# Patient Record
Sex: Female | Born: 1980 | Race: Black or African American | Hispanic: No | Marital: Single | Smoking: Never smoker
Health system: Southern US, Community
[De-identification: ages and names within clinical notes are randomized; demographics above are authoritative.]

## PROBLEM LIST (undated history)

## (undated) ENCOUNTER — Emergency Department (HOSPITAL_COMMUNITY): Admission: EM | Payer: BC Managed Care – PPO | Source: Home / Self Care

## (undated) DIAGNOSIS — G459 Transient cerebral ischemic attack, unspecified: Secondary | ICD-10-CM

## (undated) DIAGNOSIS — I10 Essential (primary) hypertension: Secondary | ICD-10-CM

## (undated) HISTORY — PX: NO PAST SURGERIES: SHX2092

## (undated) HISTORY — DX: Transient cerebral ischemic attack, unspecified: G45.9

---

## 2011-02-12 DIAGNOSIS — I10 Essential (primary) hypertension: Secondary | ICD-10-CM | POA: Insufficient documentation

## 2011-10-31 ENCOUNTER — Emergency Department: Payer: Self-pay | Admitting: Emergency Medicine

## 2011-10-31 LAB — COMPREHENSIVE METABOLIC PANEL
Albumin: 3.6 g/dL (ref 3.4–5.0)
Alkaline Phosphatase: 66 U/L (ref 50–136)
Anion Gap: 7 (ref 7–16)
Bilirubin,Total: 0.2 mg/dL (ref 0.2–1.0)
Calcium, Total: 8.6 mg/dL (ref 8.5–10.1)
Chloride: 105 mmol/L (ref 98–107)
Co2: 29 mmol/L (ref 21–32)
EGFR (African American): >60
Potassium: 3.7 mmol/L (ref 3.5–5.1)
SGPT (ALT): 21 U/L
Sodium: 141 mmol/L (ref 136–145)
Total Protein: 7.6 g/dL (ref 6.4–8.2)

## 2011-10-31 LAB — CBC
HCT: 42.5 % (ref 35.0–47.0)
HGB: 13.8 g/dL (ref 12.0–16.0)
MCH: 26.6 pg (ref 26.0–34.0)
MCHC: 32.4 g/dL (ref 32.0–36.0)
MCV: 82 fL (ref 80–100)
Platelet: 256 10*3/uL (ref 150–440)
RDW: 13.7 % (ref 11.5–14.5)
WBC: 8 10*3/uL (ref 3.6–11.0)

## 2011-10-31 LAB — CK TOTAL AND CKMB (NOT AT ARMC)
CK, Total: 95 U/L (ref 21–215)
CK-MB: <0.5 ng/mL — ABNORMAL LOW (ref 0.5–3.6)

## 2011-10-31 LAB — TROPONIN I: Troponin-I: <0.02 ng/mL

## 2012-07-05 ENCOUNTER — Encounter: Payer: Self-pay | Admitting: Family Medicine

## 2012-08-10 DIAGNOSIS — Z87410 Personal history of cervical dysplasia: Secondary | ICD-10-CM | POA: Insufficient documentation

## 2013-08-16 ENCOUNTER — Emergency Department: Payer: Self-pay | Admitting: Emergency Medicine

## 2013-08-16 LAB — BASIC METABOLIC PANEL
Anion Gap: 8 (ref 7–16)
BUN: 12 mg/dL (ref 7–18)
CALCIUM: 9.3 mg/dL (ref 8.5–10.1)
CO2: 30 mmol/L (ref 21–32)
Chloride: 99 mmol/L (ref 98–107)
Creatinine: 0.88 mg/dL (ref 0.60–1.30)
EGFR (African American): >60
EGFR (Non-African Amer.): >60
Glucose: 96 mg/dL (ref 65–99)
Osmolality: 273 (ref 275–301)
Potassium: 3.4 mmol/L — ABNORMAL LOW (ref 3.5–5.1)
Sodium: 137 mmol/L (ref 136–145)

## 2013-08-16 LAB — CBC
HCT: 44.1 % (ref 35.0–47.0)
HGB: 13.8 g/dL (ref 12.0–16.0)
MCH: 25.6 pg — ABNORMAL LOW (ref 26.0–34.0)
MCHC: 31.4 g/dL — ABNORMAL LOW (ref 32.0–36.0)
MCV: 82 fL (ref 80–100)
Platelet: 291 10*3/uL (ref 150–440)
RBC: 5.4 10*6/uL — AB (ref 3.80–5.20)
RDW: 14.1 % (ref 11.5–14.5)
WBC: 7.7 10*3/uL (ref 3.6–11.0)

## 2013-08-16 LAB — TROPONIN I: Troponin-I: <0.02 ng/mL

## 2016-12-18 ENCOUNTER — Encounter: Payer: Self-pay | Admitting: Emergency Medicine

## 2016-12-18 ENCOUNTER — Emergency Department
Admission: EM | Admit: 2016-12-18 | Discharge: 2016-12-18 | Disposition: A | Discharge status: Home / Self Care | Payer: BC Managed Care – PPO | Attending: Emergency Medicine | Admitting: Emergency Medicine

## 2016-12-18 DIAGNOSIS — I10 Essential (primary) hypertension: Secondary | ICD-10-CM | POA: Insufficient documentation

## 2016-12-18 DIAGNOSIS — N898 Other specified noninflammatory disorders of vagina: Secondary | ICD-10-CM | POA: Insufficient documentation

## 2016-12-18 HISTORY — DX: Essential (primary) hypertension: I10

## 2016-12-18 LAB — WET PREP, GENITAL
CLUE CELLS WET PREP: NONE SEEN
Sperm: NONE SEEN
TRICH WET PREP: NONE SEEN
Yeast Wet Prep HPF POC: NONE SEEN

## 2016-12-18 LAB — URINALYSIS, COMPLETE (UACMP) WITH MICROSCOPIC
BACTERIA UA: NONE SEEN
BILIRUBIN URINE: NEGATIVE
Glucose, UA: NEGATIVE mg/dL
KETONES UR: NEGATIVE mg/dL
Leukocytes, UA: NEGATIVE
Nitrite: NEGATIVE
PH: 6 (ref 5.0–8.0)
Protein, ur: 30 mg/dL — AB
Specific Gravity, Urine: 1.028 (ref 1.005–1.030)

## 2016-12-18 LAB — CHLAMYDIA/NGC RT PCR (ARMC ONLY)
CHLAMYDIA TR: NOT DETECTED
N gonorrhoeae: NOT DETECTED

## 2016-12-18 LAB — POCT PREGNANCY, URINE: Preg Test, Ur: NEGATIVE

## 2016-12-18 NOTE — Discharge Instructions (Signed)
Follow-up with your primary care doctor if not improving. Discontinue use of Flagyl. You will get the results of your cultures the first week if they are positive.

## 2016-12-18 NOTE — ED Provider Notes (Signed)
[facility] Emergency Department Provider Note  ____________________________________________   None    (approximate)  I have reviewed the triage vital signs and the nursing notes.   HISTORY  Chief Complaint Possible yeast infection   HPI Alicia Perez is a 36 y.o. female is here with complaint of white vaginal discharge. Patient states that she has a history of recurrent bacterial vaginosis and has been using Flagyl suppositories frequently for the symptoms. She now complains of burning and irritation that began last night. Patient also reports vaginal itching. Also while she is here she is requesting a gonorrhea and chlamydia culture be done.   Past Medical History:  Diagnosis Date  . Hypertension     There are no active problems to display for this patient.   No past surgical history on file.  Prior to Admission medications   Not on File    Allergies Codeine and Penicillins  No family history on file.  Social History Social History  Substance Use Topics  . Smoking status: Not on file  . Smokeless tobacco: Not on file  . Alcohol use Not on file    Review of Systems  Constitutional: No fever/chills ENT: No sore throat. Cardiovascular: Denies chest pain. Respiratory: Denies shortness of breath. Gastrointestinal: No abdominal pain.  No nausea, no vomiting.   Genitourinary: Positive for dysuria. Positive for vaginal discharge. Musculoskeletal: Negative for back pain. ___________________________________________   PHYSICAL EXAM:  VITAL SIGNS: ED Triage Vitals  Enc Vitals Group     BP 12/18/16 1535 130/81     Pulse Rate 12/18/16 1535 98     Resp 12/18/16 1535 16     Temp 12/18/16 1535 99.1 F (37.3 C)     Temp Source 12/18/16 1535 Oral     SpO2 12/18/16 1535 97 %     Weight 12/18/16 1538 160 lb (72.6 kg)     Height 12/18/16 1538 5\' 2"  (1.575 m)     Head Circumference --      Peak Flow --      Pain Score --      Pain Loc  --      Pain Edu? --      Excl. in GC? --    Constitutional: Alert and oriented. Well appearing and in no acute distress. Eyes: Conjunctivae are normal.  Head: Atraumatic. Neck: No stridor.   Cardiovascular: Normal rate, regular rhythm. Grossly normal heart sounds.  Good peripheral circulation. Respiratory: Normal respiratory effort.  No retractions. Lungs CTAB. Gastrointestinal: Soft and nontender. No distention.  Genitourinary: There is thick white substance present in the vaginal vault however is difficult to determine whether this is actual yeastlike material or if it was the medication the patient used 2 days ago. There is no adnexal masses or tenderness noted. There is no cervical tenderness on bimanual exam. Cultures and wet prep were obtained. Musculoskeletal: No lower extremity tenderness nor edema.  No joint effusions. Neurologic:  Normal speech and language. No gross focal neurologic deficits are appreciated. No gait instability. Skin:  Skin is warm, dry and intact. No rash noted. Psychiatric: Mood and affect are normal. Speech and behavior are normal.  ____________________________________________   LABS (all labs ordered are listed, but only abnormal results are displayed)  Labs Reviewed  WET PREP, GENITAL - Abnormal; Notable for the following:       Result Value   WBC, Wet Prep HPF POC FEW (*)    All other components within normal limits  URINALYSIS, COMPLETE (  UACMP) WITH MICROSCOPIC - Abnormal; Notable for the following:    Color, Urine YELLOW (*)    APPearance CLEAR (*)    Hgb urine dipstick SMALL (*)    Protein, ur 30 (*)    Squamous Epithelial / LPF 0-5 (*)    All other components within normal limits  CHLAMYDIA/NGC RT PCR ([facility] ONLY)  POC URINE PREG, ED  POCT PREGNANCY, URINE    PROCEDURES  Procedure(s) performed: None  Procedures  Critical Care performed: No  ____________________________________________   INITIAL IMPRESSION / ASSESSMENT  AND PLAN / ED COURSE  Pertinent labs & imaging results that were available during my care of the patient were reviewed by me and considered in my medical decision making (see chart for details).  Patient was strongly encouraged to discontinue using Flagyl suppositories without diagnosis. Patient states that she uses them anytime she has any symptoms and is able to get a supply of them. At this time we will treat symptoms only with over-the-counter preparations such as vagisell.  She will follow-up with her PCP if any continued problems. She also is aware that she will be called if her GC and chlamydia culture are positive.      ____________________________________________   FINAL CLINICAL IMPRESSION(S) / ED DIAGNOSES  Final diagnoses:  Vaginal irritation      NEW MEDICATIONS STARTED DURING THIS VISIT:  There are no discharge medications for this patient.    Note:  This document was prepared using Dragon voice recognition software and may include unintentional dictation errors.    Tommi Rumps, PA-C 12/18/16 1742    Sharman Cheek, MD 12/18/16 [phone]

## 2016-12-18 NOTE — ED Notes (Signed)
Pt c/o vaginal burning. Pt states she gets frequent BV infections and states she

## 2016-12-18 NOTE — ED Notes (Signed)
Pt c/o vaginal burning, pt has frequent BV infections and uses Flagyl suppositories. Pt states she last used one 2 days ago. Pt also requesting to have STD check.  GC/CT cultures obtained, encouraged patient to see PCP or [facility] for Syphillis testing and HIV testing if she desired.

## 2016-12-18 NOTE — ED Triage Notes (Signed)
Pt reports vaginal burning and irritation that began last night. Pt reports noticing a thick white cheesy like substance. Pt reports recurrent bacterial vaginosis and used a flagyl suppository but symptoms have persisted. Pt also reports vaginal itching.

## 2018-01-02 ENCOUNTER — Other Ambulatory Visit: Payer: Self-pay

## 2018-01-02 ENCOUNTER — Encounter (HOSPITAL_COMMUNITY): Payer: Self-pay | Admitting: Emergency Medicine

## 2018-01-02 ENCOUNTER — Emergency Department (HOSPITAL_COMMUNITY)
Admission: EM | Admit: 2018-01-02 | Discharge: 2018-01-02 | Disposition: A | Discharge status: Home / Self Care | Payer: BC Managed Care – PPO | Attending: Emergency Medicine | Admitting: Emergency Medicine

## 2018-01-02 DIAGNOSIS — W228XXA Striking against or struck by other objects, initial encounter: Secondary | ICD-10-CM | POA: Insufficient documentation

## 2018-01-02 DIAGNOSIS — Y9389 Activity, other specified: Secondary | ICD-10-CM | POA: Diagnosis not present

## 2018-01-02 DIAGNOSIS — S61412A Laceration without foreign body of left hand, initial encounter: Secondary | ICD-10-CM | POA: Diagnosis not present

## 2018-01-02 DIAGNOSIS — Y998 Other external cause status: Secondary | ICD-10-CM | POA: Insufficient documentation

## 2018-01-02 DIAGNOSIS — I10 Essential (primary) hypertension: Secondary | ICD-10-CM | POA: Insufficient documentation

## 2018-01-02 DIAGNOSIS — Y929 Unspecified place or not applicable: Secondary | ICD-10-CM | POA: Insufficient documentation

## 2018-01-02 MED ORDER — LIDOCAINE-EPINEPHRINE (PF) 2 %-1:200000 IJ SOLN
10.0000 mL | Freq: Once | INTRAMUSCULAR | Status: AC
  Administered 2018-01-02: 10 mL via INTRADERMAL
  Filled 2018-01-02: qty 20

## 2018-01-02 NOTE — Discharge Instructions (Addendum)
Keep your laceration clean. You can wash it with soap and water. If bleeding, apply firm pressure. Follow up in 7-10 days for suture removal. Watch for any signs of infection.

## 2018-01-02 NOTE — ED Provider Notes (Signed)
[facility] EMERGENCY DEPARTMENT Provider Note   CSN: [id number] Arrival date & time: 01/02/18  1707     History   Chief Complaint Chief Complaint  Patient presents with  . Extremity Laceration    HPI Alicia Perez is a 37 y.o. female.  HPI Alicia Perez is a 37 y.o. female presents to ED with complaint of a laceration. Pt states she was moving a refrigerator and cut the palm of left hand. Injury occurred earlier today. Reports pain over the laceration.  Pain described as sharp, moderate.  Numbness or weakness to the hand or fingertips. Tdap is up-to-date she states that she stop the bleeding, but the bleeding started again which is why she is here.  No other injuries.  She states nothing making her symptoms better or worse.  Past Medical History:  Diagnosis Date  . Hypertension     There are no active problems to display for this patient.   History reviewed. No pertinent surgical history.   OB History   None      Home Medications    Prior to Admission medications   Not on File    Family History No family history on file.  Social History Social History   Tobacco Use  . Smoking status: Never Smoker  . Smokeless tobacco: Never Used  Substance Use Topics  . Alcohol use: Yes  . Drug use: Not Currently     Allergies   Codeine and Penicillins   Review of Systems Review of Systems  Constitutional: Negative for chills and fever.  Musculoskeletal: Positive for arthralgias.  Skin: Positive for wound.  Neurological: Negative for weakness and numbness.  All other systems reviewed and are negative.    Physical Exam Updated Vital Signs BP (!) 132/94 (BP Location: Right Arm)   Pulse (!) 103   Temp 99.3 F (37.4 C) (Oral)   Resp 16   SpO2 98%   Physical Exam  Constitutional: She appears well-developed and well-nourished. No distress.  Eyes: Conjunctivae are normal.  Neck: Neck supple.  Musculoskeletal:  Full range of motion of  all fingers, specifically full flexion extension of the left fifth finger at each joint.  Strength is intact.  Sensation intact distally.  Capillary refill less than 2 seconds distally.  Neurological: She is alert.  Skin: Skin is warm and dry.  3 cm laceration to the palm of the left hand, specifically just proximal to fifth MCP joint.  Laceration appears to be superficial.  Hemostatic at this time.  Nursing note and vitals reviewed.    ED Treatments / Results  Labs (all labs ordered are listed, but only abnormal results are displayed) Labs Reviewed - No data to display  EKG None  Radiology No results found.  Procedures .Marland KitchenLaceration Repair Date/Time: 01/02/2018 6:17 PM Performed by: Jaynie Crumble, PA-C Authorized by: Jaynie Crumble, PA-C   Consent:    Consent obtained:  Verbal   Consent given by:  Patient   Risks discussed:  Infection, pain, poor cosmetic result, nerve damage and vascular damage   Alternatives discussed:  No treatment and observation Anesthesia (see MAR for exact dosages):    Anesthesia method:  Local infiltration   Local anesthetic:  Lidocaine 2% WITH epi Laceration details:    Location:  Hand   Hand location:  L palm   Length (cm):  3 Repair type:    Repair type:  Simple Pre-procedure details:    Preparation:  Patient was prepped and draped in usual sterile fashion  Exploration:    Hemostasis achieved with:  Epinephrine and direct pressure   Wound extent: no fascia violation noted, no foreign bodies/material noted, no nerve damage noted and no vascular damage noted     Contaminated: no   Treatment:    Area cleansed with:  Betadine, saline and Shur-Clens   Amount of cleaning:  Standard   Irrigation solution:  Sterile saline   Irrigation method:  Syringe   Visualized foreign bodies/material removed: no   Skin repair:    Repair method:  Sutures   Suture size:  5-0   Suture material:  Prolene   Suture technique:  Simple interrupted    Number of sutures:  4 Approximation:    Approximation:  Close Post-procedure details:    Dressing:  Sterile dressing   (including critical care time)  Medications Ordered in ED Medications - No data to display   Initial Impression / Assessment and Plan / ED Course  I have reviewed the triage vital signs and the nursing notes.  Pertinent labs & imaging results that were available during my care of the patient were reviewed by me and considered in my medical decision making (see chart for details).     Patient with a laceration to the left initially considered.  With Dermabond, however when I started cleaning the laceration continued to bleed.  Bleeding was stopped with pressure and lidocaine with epi.  Repaired with sutures.  We will have patient follow-up in 7 to 10 days for suture removal.  Instructed to keep her eye on any signs of infection.  Her tetanus is up-to-date.  Ibuprofen for pain.  Vitals:   01/02/18 1709  BP: (!) 132/94  Pulse: (!) 103  Resp: 16  Temp: 99.3 F (37.4 C)  TempSrc: Oral  SpO2: 98%     Final Clinical Impressions(s) / ED Diagnoses   Final diagnoses:  Laceration of left hand without foreign body, initial encounter    ED Discharge Orders    None       Jaynie CrumbleKirichenko, Maridee Slape, PA-C 01/02/18 1821    Long, Arlyss RepressJoshua G, MD 01/03/18 1019

## 2018-01-02 NOTE — ED Triage Notes (Signed)
C/o approx 1 inch laceration to palm of L hand from a refrigerator she was moving in her office around 2pm.

## 2018-11-23 ENCOUNTER — Ambulatory Visit: Payer: Self-pay | Admitting: Surgery

## 2018-12-02 ENCOUNTER — Other Ambulatory Visit: Payer: Self-pay

## 2018-12-02 ENCOUNTER — Encounter: Payer: Self-pay | Admitting: Surgery

## 2018-12-02 ENCOUNTER — Ambulatory Visit: Payer: BC Managed Care – PPO | Admitting: Surgery

## 2018-12-02 VITALS — BP 135/91 | HR 86 | Temp 97.2°F | Ht 63.0 in | Wt 172.0 lb

## 2018-12-02 DIAGNOSIS — K644 Residual hemorrhoidal skin tags: Secondary | ICD-10-CM | POA: Diagnosis not present

## 2018-12-02 NOTE — Progress Notes (Signed)
12/02/2018  Reason for Visit:  Thrombosed hemorrhoid  Referring Provider:  Franco Nonesheryl Lindley, FNP  History of Present Illness: Alicia Perez is a 38 y.o. female presenting for evaluation of thrombosed external hemorrhoid.  She reports that about 3 weeks ago she started having a lot of perianal pain and felt swelling and a bulge at the anal opening.  Her sister told her it was a hemorrhoid and she tried over the counter medications, but this did not work and subsequently she saw her PCP.  She was given Anusol and referred to general surgery.  The patient reports she had a history of constipation but recently she was been taking daily MiraLax and that has kept her regular with daily bowel movement without significant straining.  She notices if she misses a day of it that her stool gets harder.  She reports that the Anusol helped and she no longer has pain, but the area of swelling is still there and has not regressed.  Denies any blood in her stool or pain with bowel movement.  Past Medical History: Past Medical History:  Diagnosis Date  . Hypertension      Past Surgical History: No past surgical history on file.  Home Medications: Prior to Admission medications   Medication Sig Start Date End Date Taking? Authorizing Provider  hydrocortisone (ANUSOL-HC) 2.5 % rectal cream  11/22/18  Yes [provider]  traMADol (ULTRAM) 50 MG tablet  11/22/18  Yes [provider]    Allergies: Allergies  Allergen Reactions  . Codeine Hives  . Penicillins Hives    Social History:  reports that she has never smoked. She has never used smokeless tobacco. She reports current alcohol use. She reports previous drug use.   Family History: No family history on file.  Review of Systems: Review of Systems  Constitutional: Negative for chills and fever.  Eyes: Negative for blurred vision.  Respiratory: Negative for shortness of breath.   Cardiovascular: Negative for chest pain.   Gastrointestinal: Negative for abdominal pain, blood in stool, constipation, nausea and vomiting.  Genitourinary: Negative for dysuria.  Musculoskeletal: Negative for myalgias.  Skin: Negative for rash.  Neurological: Negative for dizziness.  Psychiatric/Behavioral: Negative for depression.    Physical Exam BP (!) 135/91   Pulse 86   Temp (!) 97.2 F (36.2 C) (Skin)   Ht 5\' 3"  (1.6 m)   Wt 172 lb (78 kg)   SpO2 98%   BMI 30.47 kg/m  CONSTITUTIONAL: No acute distress HEENT:  Normocephalic, atraumatic, extraocular motion intact. NECK: Trachea is midline, and there is no jugular venous distension.  RESPIRATORY:  Lungs are clear, and breath sounds are equal bilaterally. Normal respiratory effort without pathologic use of accessory muscles. CARDIOVASCULAR: Heart is regular without murmurs, gallops, or rubs. GI: The abdomen is soft, non-distended, non-tender.  RECTAL:  External exam reveals an enlarged but not inflamed right anterior external hemorrhoid.  No fissure or other lesions noted.  On digital exam, no enlarged internal components.  No blood on the glove. MUSCULOSKELETAL:  Normal muscle strength and tone in all four extremities.  No peripheral edema or cyanosis. SKIN: Skin turgor is normal. There are no pathologic skin lesions.  NEUROLOGIC:  Motor and sensation is grossly normal.  Cranial nerves are grossly intact. PSYCH:  Alert and oriented to person, place and time. Affect is normal.  Laboratory Analysis: No results found for this or any previous visit (from the past 24 hour(s)).  Imaging: No results found.  Assessment and Plan:  This is a 38 y.o. female with previously thrombosed external hemorrhoid.  Discussed with the patient that the swelling and enlargement of the external hemorrhoid after a flare up does take time to regress.  The important thing is that the pain has resolved.  She may continue using Anusol ointment as needed.  Discussed with her that she should  continue taking her MiraLax to keep the stool soft and decrease straining with bowel movement.  She may also do Sitz baths after bowel movements and/or twice daily to help soothe any external tissue that may be inflamed.  At this point there are no surgical needs.  Discussed with her that if the flare up happens again, to let us know within 72 hrs of symptoms so we can see her and do I&D of the hemorrhoid.  She understands this plan.  She will contact us if any issues or concerns.  Face-to-face time spent with the patient and care providers was 40 minutes, with more than 50% of the time spent counseling, educating, and coordinating care of the patient.     Melvyn Neth, [facility]

## 2018-12-02 NOTE — Patient Instructions (Addendum)
Patient to use cream as needed. The patient is aware to call back for any questions or concerns.  How to Take a [employer] IndustriesSitz Bath A sitz bath is a warm water bath that may be used to care for your rectum, genital area, or the area between your rectum and genitals (perineum). For a sitz bath, the water only comes up to your hips and covers your buttocks. A sitz bath may done at home in a bathtub or with a portable sitz bath that fits over the toilet. Your health care provider may recommend a sitz bath to help:  Relieve pain and discomfort after delivering a baby.  Relieve pain and itching from hemorrhoids or anal fissures.  Relieve pain after certain surgeries.  Relax muscles that are sore or tight. How to take a sitz bath Take 3-4 sitz baths a day, or as many as told by your health care provider. Bathtub sitz bath To take a sitz bath in a bathtub: 1. Partially fill a bathtub with warm water. The water should be deep enough to cover your hips and buttocks when you are sitting in the tub. 2. If your health care provider told you to put medicine in the water, follow his or her instructions. 3. Sit in the water. 4. Open the tub drain a little, and leave it open during your bath. 5. Turn on the warm water again, enough to replace the water that is draining out. Keep the water running throughout your bath. This helps keep the water at the right level and the right temperature. 6. Soak in the water for 15-20 minutes, or as long as told by your health care provider. 7. When you are done, be careful when you stand up. You may feel dizzy. 8. After the sitz bath, pat yourself dry. Do not rub your skin to dry it.  Over-the-toilet sitz bath To take a sitz bath with an over-the-toilet basin: 1. Follow the manufacturer's instructions. 2. Fill the basin with warm water. 3. If your health care provider told you to put medicine in the water, follow his or her instructions. 4. Sit on the seat. Make sure the  water covers your buttocks and perineum. 5. Soak in the water for 15-20 minutes, or as long as told by your health care provider. 6. After the sitz bath, pat yourself dry. Do not rub your skin to dry it. 7. Clean and dry the basin between uses. 8. Discard the basin if it cracks, or according to the manufacturer's instructions. Contact a health care provider if:  Your symptoms get worse. Do not continue with sitz baths if your symptoms get worse.  You have new symptoms. If this happens, do not continue with sitz baths until you talk with your health care provider. Summary  A sitz bath is a warm water bath in which the water only comes up to your hips and covers your buttocks.  A sitz bath may help relieve itching, relieve pain, and relax muscles that are sore or tight in the lower part of your body, including your genital area.  Take 3-4 sitz baths a day, or as many as told by your health care provider. Soak in the water for 15-20 minutes.  Do not continue with sitz baths if your symptoms get worse. This information is not intended to replace advice given to you by your health care provider. Make sure you discuss any questions you have with your health care provider. Document Released: 12/28/2003 Document Revised: 04/08/2017 Document  Reviewed: 04/08/2017 Elsevier Patient Education  [employer].

## 2019-04-12 ENCOUNTER — Telehealth: Payer: Self-pay | Admitting: Family Medicine

## 2019-04-12 NOTE — Telephone Encounter (Signed)
Patient has questions about immunizations.

## 2019-04-12 NOTE — Telephone Encounter (Signed)
TC to patient.  Questions answered re: Tdap.  Patient needs copy. RN to leave copy at info booth for patient. Aileen Fass, RN

## 2019-07-16 ENCOUNTER — Ambulatory Visit: Payer: BC Managed Care – PPO | Attending: Internal Medicine

## 2019-07-16 DIAGNOSIS — Z23 Encounter for immunization: Secondary | ICD-10-CM

## 2019-07-16 NOTE — Progress Notes (Signed)
   Covid-19 Vaccination Clinic  Name:  Alicia Perez    MRN: [id number] DOB: Oct 14, 1980  07/16/2019  Ms. Alicia Perez was observed post Covid-19 immunization for 15 minutes without incident. She was provided with Vaccine Information Sheet and instruction to access the V-Safe system.   Ms. Alicia Perez was instructed to call 911 with any severe reactions post vaccine: Marland Kitchen Difficulty breathing  . Swelling of face and throat  . A fast heartbeat  . A bad rash all over body  . Dizziness and weakness   Immunizations Administered    Name Date Dose VIS Date Route   Pfizer COVID-19 Vaccine 07/16/2019  1:15 PM 0.3 mL 03/31/2019 Intramuscular   Manufacturer: [employer], [employer]   Lot: [id number]   NDC: [address]-3

## 2019-08-09 ENCOUNTER — Ambulatory Visit: Payer: BC Managed Care – PPO | Attending: Internal Medicine

## 2019-08-09 DIAGNOSIS — Z23 Encounter for immunization: Secondary | ICD-10-CM

## 2019-08-09 NOTE — Progress Notes (Signed)
   Covid-19 Vaccination Clinic  Name:  Alicia Perez    MRN: [id number] DOB: [birth year]  08/09/2019  Alicia Perez was observed post Covid-19 immunization for 15 minutes without incident. She was provided with Vaccine Information Sheet and instruction to access the V-Safe system.   Alicia Perez was instructed to call 911 with any severe reactions post vaccine: Marland Kitchen Difficulty breathing  . Swelling of face and throat  . A fast heartbeat  . A bad rash all over body  . Dizziness and weakness   Immunizations Administered    Name Date Dose VIS Date Route   Pfizer COVID-19 Vaccine 08/09/2019 10:31 AM 0.3 mL 06/14/2018 Intramuscular   Manufacturer: [employer], [employer]   Lot: [id number]   NDC: [address]-0

## 2020-07-21 ENCOUNTER — Observation Stay
Admission: EM | Admit: 2020-07-21 | Discharge: 2020-07-22 | Disposition: A | Discharge status: Home / Self Care | Payer: BC Managed Care – PPO | Attending: Family Medicine | Admitting: Family Medicine

## 2020-07-21 ENCOUNTER — Encounter: Payer: Self-pay | Admitting: Emergency Medicine

## 2020-07-21 ENCOUNTER — Other Ambulatory Visit: Payer: Self-pay

## 2020-07-21 ENCOUNTER — Emergency Department: Payer: BC Managed Care – PPO

## 2020-07-21 DIAGNOSIS — I639 Cerebral infarction, unspecified: Secondary | ICD-10-CM | POA: Diagnosis not present

## 2020-07-21 DIAGNOSIS — I160 Hypertensive urgency: Secondary | ICD-10-CM | POA: Insufficient documentation

## 2020-07-21 DIAGNOSIS — Z79899 Other long term (current) drug therapy: Secondary | ICD-10-CM | POA: Diagnosis not present

## 2020-07-21 DIAGNOSIS — I6389 Other cerebral infarction: Principal | ICD-10-CM | POA: Diagnosis present

## 2020-07-21 DIAGNOSIS — R29702 NIHSS score 2: Secondary | ICD-10-CM | POA: Diagnosis present

## 2020-07-21 DIAGNOSIS — I10 Essential (primary) hypertension: Secondary | ICD-10-CM | POA: Diagnosis not present

## 2020-07-21 DIAGNOSIS — Z880 Allergy status to penicillin: Secondary | ICD-10-CM

## 2020-07-21 DIAGNOSIS — Z20822 Contact with and (suspected) exposure to covid-19: Secondary | ICD-10-CM | POA: Diagnosis not present

## 2020-07-21 DIAGNOSIS — E785 Hyperlipidemia, unspecified: Secondary | ICD-10-CM | POA: Diagnosis present

## 2020-07-21 DIAGNOSIS — R200 Anesthesia of skin: Secondary | ICD-10-CM | POA: Diagnosis present

## 2020-07-21 DIAGNOSIS — Z885 Allergy status to narcotic agent status: Secondary | ICD-10-CM

## 2020-07-21 LAB — COMPREHENSIVE METABOLIC PANEL
ALT: 18 U/L (ref 0–44)
AST: 17 U/L (ref 15–41)
Albumin: 4.1 g/dL (ref 3.5–5.0)
Alkaline Phosphatase: 60 U/L (ref 38–126)
Anion gap: 8 (ref 5–15)
BUN: 12 mg/dL (ref 6–20)
CO2: 26 mmol/L (ref 22–32)
Calcium: 9.4 mg/dL (ref 8.9–10.3)
Chloride: 106 mmol/L (ref 98–111)
Creatinine, Ser: 0.83 mg/dL (ref 0.44–1.00)
GFR, Estimated: >60 mL/min (ref >60)
Glucose, Bld: 117 mg/dL — ABNORMAL HIGH (ref 70–99)
Potassium: 3.5 mmol/L (ref 3.5–5.1)
Sodium: 140 mmol/L (ref 135–145)
Total Bilirubin: 0.8 mg/dL (ref 0.3–1.2)
Total Protein: 7.5 g/dL (ref 6.5–8.1)

## 2020-07-21 LAB — DIFFERENTIAL
Abs Immature Granulocytes: 0.02 10*3/uL (ref 0.00–0.07)
Basophils Absolute: 0.1 10*3/uL (ref 0.0–0.1)
Basophils Relative: 1 %
Eosinophils Absolute: 0.1 10*3/uL (ref 0.0–0.5)
Eosinophils Relative: 1 %
Immature Granulocytes: 0 %
Lymphocytes Relative: 42 %
Lymphs Abs: 4 10*3/uL (ref 0.7–4.0)
Monocytes Absolute: 0.8 10*3/uL (ref 0.1–1.0)
Monocytes Relative: 9 %
Neutro Abs: 4.4 10*3/uL (ref 1.7–7.7)
Neutrophils Relative %: 47 %

## 2020-07-21 LAB — CBC
HCT: 42.8 % (ref 36.0–46.0)
Hemoglobin: 14.4 g/dL (ref 12.0–15.0)
MCH: 26.6 pg (ref 26.0–34.0)
MCHC: 33.6 g/dL (ref 30.0–36.0)
MCV: 79 fL — ABNORMAL LOW (ref 80.0–100.0)
Platelets: 341 10*3/uL (ref 150–400)
RBC: 5.42 MIL/uL — ABNORMAL HIGH (ref 3.87–5.11)
RDW: 13.6 % (ref 11.5–15.5)
WBC: 9.4 10*3/uL (ref 4.0–10.5)
nRBC: 0 % (ref 0.0–0.2)

## 2020-07-21 LAB — POC URINE PREG, ED: Preg Test, Ur: NEGATIVE

## 2020-07-21 LAB — PROTIME-INR
INR: 1 (ref 0.8–1.2)
Prothrombin Time: 12.5 seconds (ref 11.4–15.2)

## 2020-07-21 LAB — APTT: aPTT: 27 seconds (ref 24–36)

## 2020-07-21 LAB — CBG MONITORING, ED: Glucose-Capillary: 129 mg/dL — ABNORMAL HIGH (ref 70–99)

## 2020-07-21 MED ORDER — ASPIRIN 81 MG PO CHEW
324.0000 mg | CHEWABLE_TABLET | Freq: Once | ORAL | Status: AC
  Administered 2020-07-21: 324 mg via ORAL
  Filled 2020-07-21: qty 4

## 2020-07-21 MED ORDER — ACETAMINOPHEN 160 MG/5ML PO SOLN
650.0000 mg | ORAL | Status: DC | PRN
  Filled 2020-07-21: qty 20.3

## 2020-07-21 MED ORDER — LABETALOL HCL 5 MG/ML IV SOLN
10.0000 mg | Freq: Once | INTRAVENOUS | Status: AC
  Administered 2020-07-21: 10 mg via INTRAVENOUS
  Filled 2020-07-21: qty 4

## 2020-07-21 MED ORDER — LISINOPRIL 5 MG PO TABS
2.5000 mg | ORAL_TABLET | Freq: Once | ORAL | Status: AC
  Administered 2020-07-21: 2.5 mg via ORAL
  Filled 2020-07-21: qty 1

## 2020-07-21 MED ORDER — ACETAMINOPHEN 325 MG PO TABS: 650.0000 mg | ORAL_TABLET | ORAL | Status: DC | PRN

## 2020-07-21 MED ORDER — LISINOPRIL 5 MG PO TABS: 5.0000 mg | ORAL_TABLET | Freq: Once | ORAL | Status: DC

## 2020-07-21 MED ORDER — STROKE: EARLY STAGES OF RECOVERY BOOK: Freq: Once | Status: DC

## 2020-07-21 MED ORDER — ACETAMINOPHEN 650 MG RE SUPP: 650.0000 mg | RECTAL | Status: DC | PRN

## 2020-07-21 MED ORDER — ENOXAPARIN SODIUM 40 MG/0.4ML ~~LOC~~ SOLN
40.0000 mg | SUBCUTANEOUS | Status: DC
  Administered 2020-07-22: 40 mg via SUBCUTANEOUS
  Filled 2020-07-21: qty 0.4

## 2020-07-21 MED ORDER — LORAZEPAM 2 MG/ML IJ SOLN
0.5000 mg | Freq: Once | INTRAMUSCULAR | Status: AC
  Administered 2020-07-21: 0.5 mg via INTRAVENOUS
  Filled 2020-07-21: qty 1

## 2020-07-21 NOTE — ED Notes (Signed)
Verified with Dr. Lenard Lance that pt is outside window and is LVO negative. Agree that pt is NOT code stroke.

## 2020-07-21 NOTE — H&P (Signed)
History and Physical    Alicia Perez RJJ:[id number] DOB: [birth year] DOA: 07/21/2020  PCP: Armando Gang, FNP   Patient coming from: Home  I have personally briefly reviewed patient's old medical records in [facility]  Chief Complaint: Weakness left arm and leg  HPI: Alicia Perez is a 40 y.o. female with medical history significant for hypertension who presents to the emergency room with numbness, weakness of left arm and leg starting around 10 AM on the day of presentation.  She denies headache or visual disturbance.  Has some numbness on the left side of the face.  Denies changing speech, drooling or difficulty swallowing.  No family history of stroke.  Does not use tobacco .  States she is compliant with her blood pressure medication and saw her doctor recently and was not told that her BP was not controlled. ED Course: On arrival, BP 199/108, pulse 102, respirations 20 with O2 sat 96% on room air and afebrile at 98.2.  Blood work for the most part unremarkable EKG reviewed and interpreted by myself: Sinus tach at 107 with no acute ST-T wave changes Imaging: Imaging included CT head, MRI brain, MR C-spine and MR angio head,.  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  No LVO, hemodynamically significant stenosis or other acute vascular abnormality    Neurology was consulted from the emergency room.  A small dose of labetalol was administered while awaiting MRI.  Hospitalist consulted for admission.  Review of Systems: As per HPI otherwise all other systems on review of systems negative.    Past Medical History:  Diagnosis Date  . Hypertension     History reviewed. No pertinent surgical history.   reports that she has never smoked. She has never used smokeless tobacco. She reports current alcohol use. She reports previous drug use.  Allergies  Allergen Reactions  . Codeine Hives  .  Penicillins Hives    History reviewed. No pertinent family history.    Prior to Admission medications   Medication Sig Start Date End Date Taking? Authorizing Provider  hydrocortisone (ANUSOL-HC) 2.5 % rectal cream  11/22/18   [provider]  traMADol (ULTRAM) 50 MG tablet  11/22/18   [provider]    Physical Exam: Vitals:   07/21/20 2300 07/21/20 2315 07/21/20 2330 07/21/20 2342  BP: (!) 169/133 (!) 169/102 (!) 181/155 (!) 177/109  Pulse: (!) 106 99 94 94  Resp:    18  Temp:      TempSrc:      SpO2: 99% 99% 99% 99%  Weight:      Height:         Vitals:   07/21/20 2300 07/21/20 2315 07/21/20 2330 07/21/20 2342  BP: (!) 169/133 (!) 169/102 (!) 181/155 (!) 177/109  Pulse: (!) 106 99 94 94  Resp:    18  Temp:      TempSrc:      SpO2: 99% 99% 99% 99%  Weight:      Height:          Constitutional: Alert and oriented x 3 . Not in any apparent distress HEENT:      Head: Normocephalic and atraumatic.         Eyes: PERLA, EOMI, Conjunctivae are normal. Sclera is non-icteric.       Mouth/Throat: Mucous membranes are moist.       Neck: Supple with no signs of meningismus. Cardiovascular: Regular  rate and rhythm. No murmurs, gallops, or rubs. 2+ symmetrical distal pulses are present . No JVD. No LE edema Respiratory: Respiratory effort normal .Lungs sounds clear bilaterally. No wheezes, crackles, or rhonchi.  Gastrointestinal: Soft, non tender, and non distended with positive bowel sounds.  Genitourinary: No CVA tenderness. Musculoskeletal: Nontender with normal range of motion in all extremities. No cyanosis, or erythema of extremities. Neurologic:  Face is symmetric. Moving all extremities.  Subtle left pronator drift, otherwise no gross neurologic deficit skin: Skin is warm, dry.  No rash or ulcers Psychiatric: Mood and affect are normal    Labs on Admission: I have personally reviewed following labs and imaging studies  CBC: Recent Labs  Lab  07/21/20 1731  WBC 9.4  NEUTROABS 4.4  HGB 14.4  HCT 42.8  MCV 79.0*  PLT 341   Basic Metabolic Panel: Recent Labs  Lab 07/21/20 1731  NA 140  K 3.5  CL 106  CO2 26  GLUCOSE 117*  BUN 12  CREATININE 0.83  CALCIUM 9.4   GFR: Estimated Creatinine Clearance: 90.8 mL/min (by C-G formula based on SCr of 0.83 mg/dL). Liver Function Tests: Recent Labs  Lab 07/21/20 1731  AST 17  ALT 18  ALKPHOS 60  BILITOT 0.8  PROT 7.5  ALBUMIN 4.1   No results for input(s): LIPASE, AMYLASE in the last 168 hours. No results for input(s): AMMONIA in the last 168 hours. Coagulation Profile: Recent Labs  Lab 07/21/20 1731  INR 1.0   Cardiac Enzymes: No results for input(s): CKTOTAL, CKMB, CKMBINDEX, TROPONINI in the last 168 hours. BNP (last 3 results) No results for input(s): PROBNP in the last 8760 hours. HbA1C: No results for input(s): HGBA1C in the last 72 hours. CBG: Recent Labs  Lab 07/21/20 1716  GLUCAP 129*   Lipid Profile: No results for input(s): CHOL, HDL, LDLCALC, TRIG, CHOLHDL, LDLDIRECT in the last 72 hours. Thyroid Function Tests: No results for input(s): TSH, T4TOTAL, FREET4, T3FREE, THYROIDAB in the last 72 hours. Anemia Panel: No results for input(s): VITAMINB12, FOLATE, FERRITIN, TIBC, IRON, RETICCTPCT in the last 72 hours. Urine analysis:    Component Value Date/Time   COLORURINE YELLOW (A) 12/18/2016 1602   APPEARANCEUR CLEAR (A) 12/18/2016 1602   LABSPEC 1.028 12/18/2016 1602   PHURINE 6.0 12/18/2016 1602   GLUCOSEU NEGATIVE 12/18/2016 1602   HGBUR SMALL (A) 12/18/2016 1602   BILIRUBINUR NEGATIVE 12/18/2016 1602   KETONESUR NEGATIVE 12/18/2016 1602   PROTEINUR 30 (A) 12/18/2016 1602   NITRITE NEGATIVE 12/18/2016 1602   LEUKOCYTESUR NEGATIVE 12/18/2016 1602    Radiological Exams on Admission: CT HEAD WO CONTRAST  Result Date: 07/21/2020 CLINICAL DATA:  Neuro deficit, acute stroke suspected. EXAM: CT HEAD WITHOUT CONTRAST TECHNIQUE: Contiguous  axial images were obtained from the base of the skull through the vertex without intravenous contrast. COMPARISON:  None. FINDINGS: Brain: No evidence of acute large vascular territory infarction, hemorrhage, hydrocephalus, extra-axial collection or mass lesion/mass effect. Vascular: No hyperdense vessel identified. Skull: No acute fracture Sinuses/Orbits: Mucosal thickening of the right sphenoid sinus. Otherwise, sinuses are clear. No acute orbital abnormality. Other: No mastoid effusions. IMPRESSION: No evidence of acute intracranial abnormality. Electronically Signed   By: Feliberto Harts MD   On: 07/21/2020 18:37   MR ANGIO HEAD WO CONTRAST  Result Date: 07/21/2020 CLINICAL DATA:  Initial evaluation for neuro deficit, stroke suspected. EXAM: MRI HEAD WITHOUT CONTRAST MRA HEAD WITHOUT CONTRAST TECHNIQUE: Multiplanar, multiecho pulse sequences of the brain and surrounding structures were obtained without  intravenous contrast. Angiographic images of the head were obtained using MRA technique without contrast. COMPARISON:  Prior CT from earlier the same day. FINDINGS: MRI HEAD FINDINGS Brain: Cerebral volume within normal limits. No focal parenchymal signal abnormality or significant cerebral white matter disease. 1 cm focus of restricted diffusion seen involving the right thalamus, consistent with an acute ischemic infarct. No associated hemorrhage or mass effect. No other diffusion abnormality to suggest acute or subacute ischemia. Gray-white matter differentiation otherwise maintained. No other areas of encephalomalacia to suggest chronic cortical infarction. No foci of susceptibility artifact to suggest acute or chronic intracranial hemorrhage. No mass lesion, midline shift or mass effect. No hydrocephalus or extra-axial fluid collection. Pituitary gland suprasellar region normal. Midline structures intact. Vascular: Major intracranial vascular flow voids are maintained. Skull and upper cervical spine:  Craniocervical junction normal. Bone marrow signal intensity within normal limits. No scalp soft tissue abnormality. Sinuses/Orbits: Globes and orbital soft tissues within normal limits. Right sphenoid sinus retention cyst noted. Paranasal sinuses are otherwise clear. No mastoid effusion. Inner ear structures grossly normal. Other: None. MRA HEAD FINDINGS ANTERIOR CIRCULATION: Both internal carotid arteries widely patent to the termini without stenosis. A1 segments widely patent. Normal anterior communicating artery complex. Both anterior cerebral arteries widely patent to their distal aspects without stenosis. No M1 stenosis or occlusion. Normal MCA bifurcations. Distal MCA branches well perfused and symmetric. POSTERIOR CIRCULATION: Both V4 segments patent to the vertebrobasilar junction without stenosis. Both PICA origins patent and normal. Basilar widely patent to its distal aspect without stenosis. Superior cerebellar arteries patent bilaterally. Both PCAs primarily supplied via the basilar and are well perfused to there distal aspects. No intracranial aneurysm. IMPRESSION: MRI HEAD IMPRESSION: 1. 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 2. Otherwise normal brain MRI. MRA HEAD IMPRESSION: Normal intracranial MRA. No large vessel occlusion, hemodynamically significant stenosis, or other acute vascular abnormality. Electronically Signed   By: Rise MuBenjamin  McClintock M.D.   On: 07/21/2020 22:41   MR BRAIN WO CONTRAST  Result Date: 07/21/2020 CLINICAL DATA:  Initial evaluation for neuro deficit, stroke suspected. EXAM: MRI HEAD WITHOUT CONTRAST MRA HEAD WITHOUT CONTRAST TECHNIQUE: Multiplanar, multiecho pulse sequences of the brain and surrounding structures were obtained without intravenous contrast. Angiographic images of the head were obtained using MRA technique without contrast. COMPARISON:  Prior CT from earlier the same day. FINDINGS: MRI HEAD FINDINGS Brain: Cerebral volume within normal  limits. No focal parenchymal signal abnormality or significant cerebral white matter disease. 1 cm focus of restricted diffusion seen involving the right thalamus, consistent with an acute ischemic infarct. No associated hemorrhage or mass effect. No other diffusion abnormality to suggest acute or subacute ischemia. Gray-white matter differentiation otherwise maintained. No other areas of encephalomalacia to suggest chronic cortical infarction. No foci of susceptibility artifact to suggest acute or chronic intracranial hemorrhage. No mass lesion, midline shift or mass effect. No hydrocephalus or extra-axial fluid collection. Pituitary gland suprasellar region normal. Midline structures intact. Vascular: Major intracranial vascular flow voids are maintained. Skull and upper cervical spine: Craniocervical junction normal. Bone marrow signal intensity within normal limits. No scalp soft tissue abnormality. Sinuses/Orbits: Globes and orbital soft tissues within normal limits. Right sphenoid sinus retention cyst noted. Paranasal sinuses are otherwise clear. No mastoid effusion. Inner ear structures grossly normal. Other: None. MRA HEAD FINDINGS ANTERIOR CIRCULATION: Both internal carotid arteries widely patent to the termini without stenosis. A1 segments widely patent. Normal anterior communicating artery complex. Both anterior cerebral arteries widely patent to their distal aspects  without stenosis. No M1 stenosis or occlusion. Normal MCA bifurcations. Distal MCA branches well perfused and symmetric. POSTERIOR CIRCULATION: Both V4 segments patent to the vertebrobasilar junction without stenosis. Both PICA origins patent and normal. Basilar widely patent to its distal aspect without stenosis. Superior cerebellar arteries patent bilaterally. Both PCAs primarily supplied via the basilar and are well perfused to there distal aspects. No intracranial aneurysm. IMPRESSION: MRI HEAD IMPRESSION: 1. 1 cm acute ischemic  nonhemorrhagic right thalamic infarct. 2. Otherwise normal brain MRI. MRA HEAD IMPRESSION: Normal intracranial MRA. No large vessel occlusion, hemodynamically significant stenosis, or other acute vascular abnormality. Electronically Signed   By: Rise Mu M.D.   On: 07/21/2020 22:41   MR Cervical Spine Wo Contrast  Result Date: 07/21/2020 CLINICAL DATA:  Initial evaluation for acute left-sided numbness and weakness. EXAM: MRI CERVICAL SPINE WITHOUT CONTRAST TECHNIQUE: Multiplanar, multisequence MR imaging of the cervical spine was performed. No intravenous contrast was administered. COMPARISON:  None available. FINDINGS: Alignment: Straightening with mild reversal of the normal cervical lordosis. No listhesis. Vertebrae: Vertebral body height well maintained without acute or chronic fracture. Bone marrow signal intensity within normal limits. No discrete or worrisome osseous lesions. No abnormal marrow edema. Cord: Normal signal and morphology. Posterior Fossa, vertebral arteries, paraspinal tissues: Visualized brain and posterior fossa within normal limits. Craniocervical junction normal. Paraspinous and prevertebral soft tissues within normal limits. Normal intravascular flow voids seen within the vertebral arteries bilaterally. Disc levels: C2-C3: Unremarkable. C3-C4:  Unremarkable. C4-C5: Mild annular disc bulge. No spinal stenosis. Foramina remain patent. C5-C6: Mild annular disc bulge. No spinal stenosis. Foramina remain patent. C6-C7: Minimal annular disc bulge. No spinal stenosis. Foramina remain patent. C7-T1:  Unremarkable. Visualized upper thoracic spine demonstrates no significant finding. IMPRESSION: 1. No acute abnormality within the cervical spine. 2. Mild noncompressive disc bulging at C4-5 through C6-7 without stenosis or neural impingement. Electronically Signed   By: Rise Mu M.D.   On: 07/21/2020 22:44     Assessment/Plan 40 year old female with history of  hypertension presenting with numbness, weakness of left arm and leg starting around 10 AM on the day of presentation, arriving outside TPA window.  MRI showing 1 cm acute right thalamic infarct.      Acute CVA (cerebrovascular accident) [facility] -Patient presenting with left-sided weakness arriving outside TPA window.  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  No LVO, hemodynamically significant stenosis or other acute vascular abnormality -Aspirin and statin -Allow permissive hypertension to systolic of 180- 938 -Continuous cardiac monitoring, echocardiogram, carotid Doppler -Neurology consult -PT OT consult    Hypertensive urgency -BP on arrival 199/128 -Allow permissive hypertension x24 to 48 hours to systolic 180-200    DVT prophylaxis: Lovenox  Code Status: full code  Family Communication:  none  Disposition Plan: Back to previous home environment Consults called: none  Status:At the time of admission, it appears that the appropriate admission status for this patient is INPATIENT. This is judged to be reasonable and necessary in order to provide the required intensity of service to ensure the patient's safety given the presenting symptoms, physical exam findings, and initial radiographic and laboratory data in the context of their  Comorbid conditions.   Patient requires inpatient status due to high intensity of service, high risk for further deterioration and high frequency of surveillance required.   I certify that at the point of admission it is my clinical judgment that the patient will require inpatient  hospital care spanning beyond 2 midnights     Andris Baumann MD [facility]     07/21/2020, 11:57 PM

## 2020-07-21 NOTE — ED Triage Notes (Addendum)
Pt via POV from home. Pt c/o L sided facial, arm, leg, and foot numbness. LKW today at 10:00am. Denies any weakness. Denies any vision changes. No facial palsy noted. LVO negative. Pt is A&Ox4 and NAD.

## 2020-07-21 NOTE — H&P (Incomplete)
History and Physical    Alicia Perez HEN:[id number] DOB: 24-Sep-1980 DOA: 07/21/2020  PCP: Armando Gang, FNP   Patient coming from: Home  I have personally briefly reviewed patient's old medical records in [facility]  Chief Complaint: Weakness left arm and leg  HPI: Alicia Perez is a 40 y.o. female with medical history significant for hypertension who presents to the emergency room with numbness, weakness of left arm and leg starting around 10 AM on the day of presentation.  She denies headache or visual disturbance.  Has some numbness on the left side of the face.  Denies changing speech, drooling or difficulty swallowing.  No family history of stroke.  Does not use tobacco .  States she is compliant with her blood pressure medication and saw her doctor recently and was not told that her BP was not controlled. ED Course: On arrival, BP 199/108, pulse 102, respirations 20 with O2 sat 96% on room air and afebrile at 98.2.  Blood work for the most part unremarkable EKG reviewed and interpreted by myself: Sinus tach at 107 with no acute ST-T wave changes Imaging: Imaging included CT head, MRI brain, MR C-spine and MR angio head  Review of Systems: As per HPI otherwise all other systems on review of systems negative. ***   Past Medical History:  Diagnosis Date  . Hypertension     History reviewed. No pertinent surgical history.   reports that she has never smoked. She has never used smokeless tobacco. She reports current alcohol use. She reports previous drug use.  Allergies  Allergen Reactions  . Codeine Hives  . Penicillins Hives    History reviewed. No pertinent family history. ***   Prior to Admission medications   Medication Sig Start Date End Date Taking? Authorizing Provider  hydrocortisone (ANUSOL-HC) 2.5 % rectal cream  11/22/18   [provider]  traMADol (ULTRAM) 50 MG tablet  11/22/18   [provider]    Physical Exam: Vitals:   07/21/20  2300 07/21/20 2315 07/21/20 2330 07/21/20 2342  BP: (!) 169/133 (!) 169/102 (!) 181/155 (!) 177/109  Pulse: (!) 106 99 94 94  Resp:    18  Temp:      TempSrc:      SpO2: 99% 99% 99% 99%  Weight:      Height:         Vitals:   07/21/20 2300 07/21/20 2315 07/21/20 2330 07/21/20 2342  BP: (!) 169/133 (!) 169/102 (!) 181/155 (!) 177/109  Pulse: (!) 106 99 94 94  Resp:    18  Temp:      TempSrc:      SpO2: 99% 99% 99% 99%  Weight:      Height:          Constitutional: Alert*** and oriented x 3*** . Not in any apparent distress*** HEENT:      Head: Normocephalic and atraumatic.         Eyes: PERLA, EOMI, Conjunctivae are normal. Sclera is non-icteric.       Mouth/Throat: Mucous membranes are moist.       Neck: Supple with no signs of meningismus. Cardiovascular: Regular rate and rhythm***. No murmurs, gallops, or rubs. 2+ symmetrical distal pulses are present . No JVD. No ***LE edema Respiratory: Respiratory effort normal*** .Lungs sounds clear*** bilaterally. No*** wheezes, crackles, or rhonchi.  Gastrointestinal: Soft, non tender***, and non distended with positive bowel sounds.  Genitourinary: No CVA tenderness. Musculoskeletal: Nontender with normal range of motion  in all extremities***. No cyanosis, or erythema of extremities. Neurologic:  Face is symmetric. Moving all extremities. No gross focal neurologic deficits ***. Skin: Skin is warm, dry.  No rash or ulcers*** Psychiatric: Mood and affect are normal***    Labs on Admission: I have personally reviewed following labs and imaging studies  CBC: Recent Labs  Lab 07/21/20 1731  WBC 9.4  NEUTROABS 4.4  HGB 14.4  HCT 42.8  MCV 79.0*  PLT 341   Basic Metabolic Panel: Recent Labs  Lab 07/21/20 1731  NA 140  K 3.5  CL 106  CO2 26  GLUCOSE 117*  BUN 12  CREATININE 0.83  CALCIUM 9.4   GFR: Estimated Creatinine Clearance: 90.8 mL/min (by C-G formula based on SCr of 0.83 mg/dL). Liver Function  Tests: Recent Labs  Lab 07/21/20 1731  AST 17  ALT 18  ALKPHOS 60  BILITOT 0.8  PROT 7.5  ALBUMIN 4.1   No results for input(s): LIPASE, AMYLASE in the last 168 hours. No results for input(s): AMMONIA in the last 168 hours. Coagulation Profile: Recent Labs  Lab 07/21/20 1731  INR 1.0   Cardiac Enzymes: No results for input(s): CKTOTAL, CKMB, CKMBINDEX, TROPONINI in the last 168 hours. BNP (last 3 results) No results for input(s): PROBNP in the last 8760 hours. HbA1C: No results for input(s): HGBA1C in the last 72 hours. CBG: Recent Labs  Lab 07/21/20 1716  GLUCAP 129*   Lipid Profile: No results for input(s): CHOL, HDL, LDLCALC, TRIG, CHOLHDL, LDLDIRECT in the last 72 hours. Thyroid Function Tests: No results for input(s): TSH, T4TOTAL, FREET4, T3FREE, THYROIDAB in the last 72 hours. Anemia Panel: No results for input(s): VITAMINB12, FOLATE, FERRITIN, TIBC, IRON, RETICCTPCT in the last 72 hours. Urine analysis:    Component Value Date/Time   COLORURINE YELLOW (A) 12/18/2016 1602   APPEARANCEUR CLEAR (A) 12/18/2016 1602   LABSPEC 1.028 12/18/2016 1602   PHURINE 6.0 12/18/2016 1602   GLUCOSEU NEGATIVE 12/18/2016 1602   HGBUR SMALL (A) 12/18/2016 1602   BILIRUBINUR NEGATIVE 12/18/2016 1602   KETONESUR NEGATIVE 12/18/2016 1602   PROTEINUR 30 (A) 12/18/2016 1602   NITRITE NEGATIVE 12/18/2016 1602   LEUKOCYTESUR NEGATIVE 12/18/2016 1602    Radiological Exams on Admission: CT HEAD WO CONTRAST  Result Date: 07/21/2020 CLINICAL DATA:  Neuro deficit, acute stroke suspected. EXAM: CT HEAD WITHOUT CONTRAST TECHNIQUE: Contiguous axial images were obtained from the base of the skull through the vertex without intravenous contrast. COMPARISON:  None. FINDINGS: Brain: No evidence of acute large vascular territory infarction, hemorrhage, hydrocephalus, extra-axial collection or mass lesion/mass effect. Vascular: No hyperdense vessel identified. Skull: No acute fracture  Sinuses/Orbits: Mucosal thickening of the right sphenoid sinus. Otherwise, sinuses are clear. No acute orbital abnormality. Other: No mastoid effusions. IMPRESSION: No evidence of acute intracranial abnormality. Electronically Signed   By: Feliberto HartsFrederick S Jones MD   On: 07/21/2020 18:37   MR ANGIO HEAD WO CONTRAST  Result Date: 07/21/2020 CLINICAL DATA:  Initial evaluation for neuro deficit, stroke suspected. EXAM: MRI HEAD WITHOUT CONTRAST MRA HEAD WITHOUT CONTRAST TECHNIQUE: Multiplanar, multiecho pulse sequences of the brain and surrounding structures were obtained without intravenous contrast. Angiographic images of the head were obtained using MRA technique without contrast. COMPARISON:  Prior CT from earlier the same day. FINDINGS: MRI HEAD FINDINGS Brain: Cerebral volume within normal limits. No focal parenchymal signal abnormality or significant cerebral white matter disease. 1 cm focus of restricted diffusion seen involving the right thalamus, consistent with an acute ischemic infarct.  No associated hemorrhage or mass effect. No other diffusion abnormality to suggest acute or subacute ischemia. Gray-white matter differentiation otherwise maintained. No other areas of encephalomalacia to suggest chronic cortical infarction. No foci of susceptibility artifact to suggest acute or chronic intracranial hemorrhage. No mass lesion, midline shift or mass effect. No hydrocephalus or extra-axial fluid collection. Pituitary gland suprasellar region normal. Midline structures intact. Vascular: Major intracranial vascular flow voids are maintained. Skull and upper cervical spine: Craniocervical junction normal. Bone marrow signal intensity within normal limits. No scalp soft tissue abnormality. Sinuses/Orbits: Globes and orbital soft tissues within normal limits. Right sphenoid sinus retention cyst noted. Paranasal sinuses are otherwise clear. No mastoid effusion. Inner ear structures grossly normal. Other: None. MRA  HEAD FINDINGS ANTERIOR CIRCULATION: Both internal carotid arteries widely patent to the termini without stenosis. A1 segments widely patent. Normal anterior communicating artery complex. Both anterior cerebral arteries widely patent to their distal aspects without stenosis. No M1 stenosis or occlusion. Normal MCA bifurcations. Distal MCA branches well perfused and symmetric. POSTERIOR CIRCULATION: Both V4 segments patent to the vertebrobasilar junction without stenosis. Both PICA origins patent and normal. Basilar widely patent to its distal aspect without stenosis. Superior cerebellar arteries patent bilaterally. Both PCAs primarily supplied via the basilar and are well perfused to there distal aspects. No intracranial aneurysm. IMPRESSION: MRI HEAD IMPRESSION: 1. 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 2. Otherwise normal brain MRI. MRA HEAD IMPRESSION: Normal intracranial MRA. No large vessel occlusion, hemodynamically significant stenosis, or other acute vascular abnormality. Electronically Signed   By: Rise Mu M.D.   On: 07/21/2020 22:41   MR BRAIN WO CONTRAST  Result Date: 07/21/2020 CLINICAL DATA:  Initial evaluation for neuro deficit, stroke suspected. EXAM: MRI HEAD WITHOUT CONTRAST MRA HEAD WITHOUT CONTRAST TECHNIQUE: Multiplanar, multiecho pulse sequences of the brain and surrounding structures were obtained without intravenous contrast. Angiographic images of the head were obtained using MRA technique without contrast. COMPARISON:  Prior CT from earlier the same day. FINDINGS: MRI HEAD FINDINGS Brain: Cerebral volume within normal limits. No focal parenchymal signal abnormality or significant cerebral white matter disease. 1 cm focus of restricted diffusion seen involving the right thalamus, consistent with an acute ischemic infarct. No associated hemorrhage or mass effect. No other diffusion abnormality to suggest acute or subacute ischemia. Gray-white matter  differentiation otherwise maintained. No other areas of encephalomalacia to suggest chronic cortical infarction. No foci of susceptibility artifact to suggest acute or chronic intracranial hemorrhage. No mass lesion, midline shift or mass effect. No hydrocephalus or extra-axial fluid collection. Pituitary gland suprasellar region normal. Midline structures intact. Vascular: Major intracranial vascular flow voids are maintained. Skull and upper cervical spine: Craniocervical junction normal. Bone marrow signal intensity within normal limits. No scalp soft tissue abnormality. Sinuses/Orbits: Globes and orbital soft tissues within normal limits. Right sphenoid sinus retention cyst noted. Paranasal sinuses are otherwise clear. No mastoid effusion. Inner ear structures grossly normal. Other: None. MRA HEAD FINDINGS ANTERIOR CIRCULATION: Both internal carotid arteries widely patent to the termini without stenosis. A1 segments widely patent. Normal anterior communicating artery complex. Both anterior cerebral arteries widely patent to their distal aspects without stenosis. No M1 stenosis or occlusion. Normal MCA bifurcations. Distal MCA branches well perfused and symmetric. POSTERIOR CIRCULATION: Both V4 segments patent to the vertebrobasilar junction without stenosis. Both PICA origins patent and normal. Basilar widely patent to its distal aspect without stenosis. Superior cerebellar arteries patent bilaterally. Both PCAs primarily supplied via the basilar and are well perfused to  there distal aspects. No intracranial aneurysm. IMPRESSION: MRI HEAD IMPRESSION: 1. 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 2. Otherwise normal brain MRI. MRA HEAD IMPRESSION: Normal intracranial MRA. No large vessel occlusion, hemodynamically significant stenosis, or other acute vascular abnormality. Electronically Signed   By: Rise Mu M.D.   On: 07/21/2020 22:41   MR Cervical Spine Wo Contrast  Result Date:  07/21/2020 CLINICAL DATA:  Initial evaluation for acute left-sided numbness and weakness. EXAM: MRI CERVICAL SPINE WITHOUT CONTRAST TECHNIQUE: Multiplanar, multisequence MR imaging of the cervical spine was performed. No intravenous contrast was administered. COMPARISON:  None available. FINDINGS: Alignment: Straightening with mild reversal of the normal cervical lordosis. No listhesis. Vertebrae: Vertebral body height well maintained without acute or chronic fracture. Bone marrow signal intensity within normal limits. No discrete or worrisome osseous lesions. No abnormal marrow edema. Cord: Normal signal and morphology. Posterior Fossa, vertebral arteries, paraspinal tissues: Visualized brain and posterior fossa within normal limits. Craniocervical junction normal. Paraspinous and prevertebral soft tissues within normal limits. Normal intravascular flow voids seen within the vertebral arteries bilaterally. Disc levels: C2-C3: Unremarkable. C3-C4:  Unremarkable. C4-C5: Mild annular disc bulge. No spinal stenosis. Foramina remain patent. C5-C6: Mild annular disc bulge. No spinal stenosis. Foramina remain patent. C6-C7: Minimal annular disc bulge. No spinal stenosis. Foramina remain patent. C7-T1:  Unremarkable. Visualized upper thoracic spine demonstrates no significant finding. IMPRESSION: 1. No acute abnormality within the cervical spine. 2. Mild noncompressive disc bulging at C4-5 through C6-7 without stenosis or neural impingement. Electronically Signed   By: Rise Mu M.D.   On: 07/21/2020 22:44     Assessment/Plan Active Problems:   Acute CVA (cerebrovascular accident) (HCC)   Hypertensive urgency    DVT prophylaxis: Lovenox***  Code Status: full code***  Family Communication:  none***  Disposition Plan: Back to previous home environment Consults called: none***  Status:***    Andris Baumann MD [facility]     07/21/2020, 11:57 PM

## 2020-07-21 NOTE — ED Provider Notes (Signed)
[facility] Emergency Department Provider Note   ____________________________________________   Event Date/Time   First MD Initiated Contact with Patient 07/21/20 1946     (approximate)  I have reviewed the triage vital signs and the nursing notes.   HISTORY  Chief Complaint Numbness    HPI Alicia Perez is a 40 y.o. female here for evaluation of numbness in the left arm and left leg  Patient reports that she got up this morning at 10 AM and noticed her left arm and left leg felt a little bit numb.  No weakness noted.  No other symptoms no headache no chest pain no trouble breathing.  She reports that throughout the day she has had a persistent feeling of numbness in the arm and leg only not the face.  She does have a history of high blood pressure took her lisinopril this morning.   Patient reports that she was last normal at about 3 AM.  She watched the basketball game and got home and went to bed about 3 AM.  Awoke with symptoms at 10 AM.  No neck pain.  No recent injuries.  Past Medical History:  Diagnosis Date  . Hypertension     There are no problems to display for this patient.   History reviewed. No pertinent surgical history.  Prior to Admission medications   Medication Sig Start Date End Date Taking? Authorizing Provider  hydrocortisone (ANUSOL-HC) 2.5 % rectal cream  11/22/18   [provider]  traMADol Janean Sark(ULTRAM) 50 MG tablet  11/22/18   [provider]    Allergies Codeine and Penicillins  History reviewed. No pertinent family history.  Social History Social History   Tobacco Use  . Smoking status: Never Smoker  . Smokeless tobacco: Never Used  Substance Use Topics  . Alcohol use: Yes  . Drug use: Not Currently    Review of Systems Constitutional: No fever/chills Eyes: No visual changes. ENT: No sore throat. Cardiovascular: Denies chest pain. Respiratory: Denies shortness of  breath. Gastrointestinal: No abdominal pain.   Genitourinary: Negative for dysuria. Musculoskeletal: Negative for back pain. Skin: Negative for rash. Neurological: Negative for headaches, areas of focal weakness or numbness except as noted left arm left leg.    ____________________________________________   PHYSICAL EXAM:  VITAL SIGNS: ED Triage Vitals  Enc Vitals Group     BP 07/21/20 1716 (!) 199/128     Pulse Rate 07/21/20 1716 (!) 102     Resp 07/21/20 1716 20     Temp 07/21/20 1716 98.2 F (36.8 C)     Temp Source 07/21/20 1716 Oral     SpO2 07/21/20 1716 96 %     Weight 07/21/20 1713 175 lb (79.4 kg)     Height 07/21/20 1713 5\' 3"  (1.6 m)     Head Circumference --      Peak Flow --      Pain Score 07/21/20 1713 0     Pain Loc --      Pain Edu? --      Excl. in GC? --     Constitutional: Alert and oriented. Well appearing and in no acute distress. Eyes: Conjunctivae are normal. Head: Atraumatic. Nose: No congestion/rhinnorhea. Mouth/Throat: Mucous membranes are moist. Neck: No stridor.  Cardiovascular: Normal rate, regular rhythm. Grossly normal heart sounds.  Good peripheral circulation. Respiratory: Normal respiratory effort.  No retractions. Lungs CTAB. Gastrointestinal: Soft and nontender. No distention. Musculoskeletal: No lower extremity tenderness nor edema. Neurologic:  Normal  speech and language. No gross focal neurologic deficits are appreciated except noted by NIH.  NIH score equals 2.  Very mild left arm pronator drift.  Diminished but not dense loss of sensation left arm and left leg.  Normal cranial nerve exam normal level of alertness.  Normal strength in all extremities for slight drift left arm Skin:  Skin is warm, dry and intact. No rash noted. Psychiatric: Mood and affect are normal. Speech and behavior are normal.  ____________________________________________   LABS (all labs ordered are listed, but only abnormal results are  displayed)  Labs Reviewed  CBC - Abnormal; Notable for the following components:      Result Value   RBC 5.42 (*)    MCV 79.0 (*)    All other components within normal limits  COMPREHENSIVE METABOLIC PANEL - Abnormal; Notable for the following components:   Glucose, Bld 117 (*)    All other components within normal limits  CBG MONITORING, ED - Abnormal; Notable for the following components:   Glucose-Capillary 129 (*)    All other components within normal limits  RESP PANEL BY RT-PCR (FLU A&B, COVID) ARPGX2  PROTIME-INR  APTT  DIFFERENTIAL  POC URINE PREG, ED   ____________________________________________  EKG  Reviewed inter by me at 1720 Heart rate 110 QRS 79 QTc 420 Normal sinus rhythm left ventricular hypertrophy ____________________________________________  RADIOLOGY  CT HEAD WO CONTRAST  Result Date: 07/21/2020 CLINICAL DATA:  Neuro deficit, acute stroke suspected. EXAM: CT HEAD WITHOUT CONTRAST TECHNIQUE: Contiguous axial images were obtained from the base of the skull through the vertex without intravenous contrast. COMPARISON:  None. FINDINGS: Brain: No evidence of acute large vascular territory infarction, hemorrhage, hydrocephalus, extra-axial collection or mass lesion/mass effect. Vascular: No hyperdense vessel identified. Skull: No acute fracture Sinuses/Orbits: Mucosal thickening of the right sphenoid sinus. Otherwise, sinuses are clear. No acute orbital abnormality. Other: No mastoid effusions. IMPRESSION: No evidence of acute intracranial abnormality. Electronically Signed   By: Feliberto Harts MD   On: 07/21/2020 18:37   MR ANGIO HEAD WO CONTRAST  Result Date: 07/21/2020 CLINICAL DATA:  Initial evaluation for neuro deficit, stroke suspected. EXAM: MRI HEAD WITHOUT CONTRAST MRA HEAD WITHOUT CONTRAST TECHNIQUE: Multiplanar, multiecho pulse sequences of the brain and surrounding structures were obtained without intravenous contrast. Angiographic images of the head  were obtained using MRA technique without contrast. COMPARISON:  Prior CT from earlier the same day. FINDINGS: MRI HEAD FINDINGS Brain: Cerebral volume within normal limits. No focal parenchymal signal abnormality or significant cerebral white matter disease. 1 cm focus of restricted diffusion seen involving the right thalamus, consistent with an acute ischemic infarct. No associated hemorrhage or mass effect. No other diffusion abnormality to suggest acute or subacute ischemia. Gray-white matter differentiation otherwise maintained. No other areas of encephalomalacia to suggest chronic cortical infarction. No foci of susceptibility artifact to suggest acute or chronic intracranial hemorrhage. No mass lesion, midline shift or mass effect. No hydrocephalus or extra-axial fluid collection. Pituitary gland suprasellar region normal. Midline structures intact. Vascular: Major intracranial vascular flow voids are maintained. Skull and upper cervical spine: Craniocervical junction normal. Bone marrow signal intensity within normal limits. No scalp soft tissue abnormality. Sinuses/Orbits: Globes and orbital soft tissues within normal limits. Right sphenoid sinus retention cyst noted. Paranasal sinuses are otherwise clear. No mastoid effusion. Inner ear structures grossly normal. Other: None. MRA HEAD FINDINGS ANTERIOR CIRCULATION: Both internal carotid arteries widely patent to the termini without stenosis. A1 segments widely patent. Normal anterior communicating artery  complex. Both anterior cerebral arteries widely patent to their distal aspects without stenosis. No M1 stenosis or occlusion. Normal MCA bifurcations. Distal MCA branches well perfused and symmetric. POSTERIOR CIRCULATION: Both V4 segments patent to the vertebrobasilar junction without stenosis. Both PICA origins patent and normal. Basilar widely patent to its distal aspect without stenosis. Superior cerebellar arteries patent bilaterally. Both PCAs  primarily supplied via the basilar and are well perfused to there distal aspects. No intracranial aneurysm. IMPRESSION: MRI HEAD IMPRESSION: 1. 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 2. Otherwise normal brain MRI. MRA HEAD IMPRESSION: Normal intracranial MRA. No large vessel occlusion, hemodynamically significant stenosis, or other acute vascular abnormality. Electronically Signed   By: Rise Mu M.D.   On: 07/21/2020 22:41   MR BRAIN WO CONTRAST  Result Date: 07/21/2020 CLINICAL DATA:  Initial evaluation for neuro deficit, stroke suspected. EXAM: MRI HEAD WITHOUT CONTRAST MRA HEAD WITHOUT CONTRAST TECHNIQUE: Multiplanar, multiecho pulse sequences of the brain and surrounding structures were obtained without intravenous contrast. Angiographic images of the head were obtained using MRA technique without contrast. COMPARISON:  Prior CT from earlier the same day. FINDINGS: MRI HEAD FINDINGS Brain: Cerebral volume within normal limits. No focal parenchymal signal abnormality or significant cerebral white matter disease. 1 cm focus of restricted diffusion seen involving the right thalamus, consistent with an acute ischemic infarct. No associated hemorrhage or mass effect. No other diffusion abnormality to suggest acute or subacute ischemia. Gray-white matter differentiation otherwise maintained. No other areas of encephalomalacia to suggest chronic cortical infarction. No foci of susceptibility artifact to suggest acute or chronic intracranial hemorrhage. No mass lesion, midline shift or mass effect. No hydrocephalus or extra-axial fluid collection. Pituitary gland suprasellar region normal. Midline structures intact. Vascular: Major intracranial vascular flow voids are maintained. Skull and upper cervical spine: Craniocervical junction normal. Bone marrow signal intensity within normal limits. No scalp soft tissue abnormality. Sinuses/Orbits: Globes and orbital soft tissues within  normal limits. Right sphenoid sinus retention cyst noted. Paranasal sinuses are otherwise clear. No mastoid effusion. Inner ear structures grossly normal. Other: None. MRA HEAD FINDINGS ANTERIOR CIRCULATION: Both internal carotid arteries widely patent to the termini without stenosis. A1 segments widely patent. Normal anterior communicating artery complex. Both anterior cerebral arteries widely patent to their distal aspects without stenosis. No M1 stenosis or occlusion. Normal MCA bifurcations. Distal MCA branches well perfused and symmetric. POSTERIOR CIRCULATION: Both V4 segments patent to the vertebrobasilar junction without stenosis. Both PICA origins patent and normal. Basilar widely patent to its distal aspect without stenosis. Superior cerebellar arteries patent bilaterally. Both PCAs primarily supplied via the basilar and are well perfused to there distal aspects. No intracranial aneurysm. IMPRESSION: MRI HEAD IMPRESSION: 1. 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 2. Otherwise normal brain MRI. MRA HEAD IMPRESSION: Normal intracranial MRA. No large vessel occlusion, hemodynamically significant stenosis, or other acute vascular abnormality. Electronically Signed   By: Rise Mu M.D.   On: 07/21/2020 22:41   MR Cervical Spine Wo Contrast  Result Date: 07/21/2020 CLINICAL DATA:  Initial evaluation for acute left-sided numbness and weakness. EXAM: MRI CERVICAL SPINE WITHOUT CONTRAST TECHNIQUE: Multiplanar, multisequence MR imaging of the cervical spine was performed. No intravenous contrast was administered. COMPARISON:  None available. FINDINGS: Alignment: Straightening with mild reversal of the normal cervical lordosis. No listhesis. Vertebrae: Vertebral body height well maintained without acute or chronic fracture. Bone marrow signal intensity within normal limits. No discrete or worrisome osseous lesions. No abnormal marrow edema. Cord: Normal  signal and morphology.  Posterior Fossa, vertebral arteries, paraspinal tissues: Visualized brain and posterior fossa within normal limits. Craniocervical junction normal. Paraspinous and prevertebral soft tissues within normal limits. Normal intravascular flow voids seen within the vertebral arteries bilaterally. Disc levels: C2-C3: Unremarkable. C3-C4:  Unremarkable. C4-C5: Mild annular disc bulge. No spinal stenosis. Foramina remain patent. C5-C6: Mild annular disc bulge. No spinal stenosis. Foramina remain patent. C6-C7: Minimal annular disc bulge. No spinal stenosis. Foramina remain patent. C7-T1:  Unremarkable. Visualized upper thoracic spine demonstrates no significant finding. IMPRESSION: 1. No acute abnormality within the cervical spine. 2. Mild noncompressive disc bulging at C4-5 through C6-7 without stenosis or neural impingement. Electronically Signed   By: Rise Mu M.D.   On: 07/21/2020 22:44    CT head negative for acute  1 cm acute ischemic nonhemorrhagic right thalamic infarct.,  Personally viewed by me MRI  ____________________________________________   PROCEDURES  Procedure(s) performed: None  Procedures  Critical Care performed: CRITICAL CARE Performed by: Sharyn Creamer   Total critical care time: 25 minutes  Critical care time was exclusive of separately billable procedures and treating other patients.  Critical care was necessary to treat or prevent imminent or life-threatening deterioration.  Critical care was time spent personally by me on the following activities: development of treatment plan with patient and/or surrogate as well as nursing, discussions with consultants, evaluation of patient's response to treatment, examination of patient, obtaining history from patient or surrogate, ordering and performing treatments and interventions, ordering and review of laboratory studies, ordering and review of radiographic studies, pulse oximetry and re-evaluation of patient's  condition.  Patient noted to have acute nonhemorrhagic stroke on MRI imaging.  Initiate salicylates, have already consulted with neurology.  No evidence of large vessel occlusion by angiogram or clinical examination.  Patient is not a TPA candidate she is presenting well outside of the TPA window last known well at approximately 3 AM today ____________________________________________   INITIAL IMPRESSION / ASSESSMENT AND PLAN / ED COURSE  Pertinent labs & imaging results that were available during my care of the patient were reviewed by me and considered in my medical decision making (see chart for details).   Patient presents for acute numbness over the left arm and left leg.  Van exam negative for LVO.  She does have subtle decreased sensation only over the arm and leg.  I am concerned however that she has significant associated hypertension.  My differential certainly includes multiple causes, but in this case high in the differential included are stroke and hypertensive emergency.  She does however lack visual changes no headache no chest pain and this would argue against hypertensive emergency.  In addition she does not have evidence of hemorrhage on CT scan.  I discussed the case with Dr. Selina Cooley our neurologist who advised mild efforts at blood pressure control as we await pending MRI to evaluate for hemorrhage.  No associated acute pulmonary or cardiac symptoms no abdominal pain.  Denies pregnancy.  Reassuring neurologic exam with exception to slight pronator drift and minimally decreased sensation  ----------------------------------------- 8:54 PM on 07/21/2020 ----------------------------------------- Blood pressure improved 171/116, current goal to keep systolic 160 or greater per Dr. Selina Cooley until MRI.  At present comfortable with her current blood pressure which is improved from presentation.   Clinical Course as of 07/21/20 2252  Sun Jul 21, 2020  2217 BP 160/121 - essentially exactly  at goal recommneded by neurology given the unclear etiology of stroke vs. Htn emergency [MQ]  Clinical Course User Index [MQ] Sharyn Creamer, MD    ----------------------------------------- 10:51 PM on 07/21/2020 -----------------------------------------  Vitals:   07/21/20 2029 07/21/20 2030  BP: (!) 169/124 (!) 171/116  Pulse:  (!) 102  Resp: 14 (!) 23  Temp:    SpO2:  100%     Patient being admitted to the hospital service, case and care discussed with Dr. Para March  Patient agreeable understanding of plan for admission.  Will initiate salicylate therapy.  Full neurology consult anticipated tomorrow have already discussed with Dr. Selina Cooley previous. ____________________________________________   FINAL CLINICAL IMPRESSION(S) / ED DIAGNOSES  Final diagnoses:  Acute stroke due to ischemia [facility])        Note:  This document was prepared using Dragon voice recognition software and may include unintentional dictation errors       Sharyn Creamer, MD 07/22/20 [phone]

## 2020-07-21 NOTE — ED Notes (Signed)
Patient transported to MRI 

## 2020-07-22 ENCOUNTER — Inpatient Hospital Stay: Payer: BC Managed Care – PPO

## 2020-07-22 ENCOUNTER — Inpatient Hospital Stay
Admit: 2020-07-22 | Discharge: 2020-07-22 | Disposition: A | Discharge status: Home / Self Care | Payer: BC Managed Care – PPO | Attending: Internal Medicine | Admitting: Internal Medicine

## 2020-07-22 DIAGNOSIS — I639 Cerebral infarction, unspecified: Secondary | ICD-10-CM | POA: Diagnosis not present

## 2020-07-22 LAB — LIPID PANEL
Cholesterol: 252 mg/dL — ABNORMAL HIGH (ref 0–200)
HDL: 51 mg/dL (ref >40)
LDL Cholesterol: 182 mg/dL — ABNORMAL HIGH (ref 0–99)
Total CHOL/HDL Ratio: 4.9 RATIO
Triglycerides: 97 mg/dL (ref <150)
VLDL: 19 mg/dL (ref 0–40)

## 2020-07-22 LAB — RESP PANEL BY RT-PCR (FLU A&B, COVID) ARPGX2
Influenza A by PCR: NEGATIVE
Influenza B by PCR: NEGATIVE
SARS Coronavirus 2 by RT PCR: NEGATIVE

## 2020-07-22 LAB — CBC
HCT: 41.3 % (ref 36.0–46.0)
Hemoglobin: 13.8 g/dL (ref 12.0–15.0)
MCH: 26.3 pg (ref 26.0–34.0)
MCHC: 33.4 g/dL (ref 30.0–36.0)
MCV: 78.7 fL — ABNORMAL LOW (ref 80.0–100.0)
Platelets: 320 10*3/uL (ref 150–400)
RBC: 5.25 MIL/uL — ABNORMAL HIGH (ref 3.87–5.11)
RDW: 13.5 % (ref 11.5–15.5)
WBC: 9.1 10*3/uL (ref 4.0–10.5)
nRBC: 0 % (ref 0.0–0.2)

## 2020-07-22 LAB — HEMOGLOBIN A1C
Hgb A1c MFr Bld: 6.3 % — ABNORMAL HIGH (ref 4.8–5.6)
Mean Plasma Glucose: 134.11 mg/dL

## 2020-07-22 LAB — HIV ANTIBODY (ROUTINE TESTING W REFLEX): HIV Screen 4th Generation wRfx: NONREACTIVE

## 2020-07-22 LAB — CREATININE, SERUM
Creatinine, Ser: 0.86 mg/dL (ref 0.44–1.00)
GFR, Estimated: >60 mL/min (ref >60)

## 2020-07-22 MED ORDER — ASPIRIN 81 MG PO TBEC: 81.0000 mg | DELAYED_RELEASE_TABLET | Freq: Every day | ORAL | 11 refills | Status: AC

## 2020-07-22 MED ORDER — ATORVASTATIN CALCIUM 40 MG PO TABS: 40.0000 mg | ORAL_TABLET | Freq: Every day | ORAL | 11 refills | Status: AC

## 2020-07-22 MED ORDER — ASPIRIN EC 81 MG PO TBEC
81.0000 mg | DELAYED_RELEASE_TABLET | Freq: Every day | ORAL | Status: DC
  Administered 2020-07-22: 81 mg via ORAL
  Filled 2020-07-22 (×2): qty 1

## 2020-07-22 MED ORDER — ATORVASTATIN CALCIUM 20 MG PO TABS
40.0000 mg | ORAL_TABLET | Freq: Every day | ORAL | Status: DC
  Administered 2020-07-22: 40 mg via ORAL
  Filled 2020-07-22: qty 2

## 2020-07-22 NOTE — ED Notes (Signed)
Patient is resting comfortably. 

## 2020-07-22 NOTE — Progress Notes (Signed)
SLP Cancellation Note  Patient Details Name: Alicia Perez MRN: [id number] DOB: 1981-01-18   Cancelled treatment:       Reason Eval/Treat Not Completed: SLP screened, no needs identified, will sign off   Chart reviewed, pt unable to identify any acute cognitive linguistic deficits. Pt passed [employer] Screen, sent secure chat to attending regarding results of [employer].   Zeplin Aleshire B. Dreama Saa M.S., CCC-SLP, [facility] Speech-Language Pathologist Rehabilitation Services Office [phone]    Hester Joslin Dreama Saa 07/22/2020, 8:14 AM

## 2020-07-22 NOTE — Evaluation (Signed)
Occupational Therapy Evaluation Patient Details Name: Alicia Perez MRN: [id number] DOB: [birth year] Today's Date: 07/22/2020    History of Present Illness Alicia Perez is a 40 y.o. female with medical history significant for hypertension who presents to the emergency room with numbness, weakness of left arm and leg starting around 10 AM on the day of presentation.  She denies headache or visual disturbance. MRI/MRA brain with findings significant for 1 cm acute ischemic nonhemorrhagic right thalamic infarct.   Clinical Impression   Alicia Perez was seen for OT evaluation this date. Prior to hospital admission, pt was Independent in all aspects of ADL/IADL, including working full time in Public relations account executive and driving. Pt lives alone in [place] entry town home, daughter in school home on breaks. Currently pt reporting only non-dominant L hand tingling unresolved. Pt demonstrates baseline independence to perform ADL and mobility tasks and no strength, coordination, cognitive, or visual deficits appreciated with assessment. No skilled acute OT needs identified. Will sign off. Please re-consult if additional OT needs arise.     Follow Up Recommendations  No OT follow up    Equipment Recommendations  None recommended by OT    Recommendations for Other Services       Precautions / Restrictions Precautions Precautions: None Restrictions Weight Bearing Restrictions: No      Mobility Bed Mobility Overal bed mobility: Independent                  Transfers Overall transfer level: Independent                    Balance Overall balance assessment: Independent                                         ADL either performed or assessed with clinical judgement   ADL Overall ADL's : Independent                                                          Pertinent Vitals/Pain Pain Assessment: No/denies pain     Hand Dominance Right    Extremity/Trunk Assessment Upper Extremity Assessment Upper Extremity Assessment: Overall WFL for tasks assessed   Lower Extremity Assessment Lower Extremity Assessment: Overall WFL for tasks assessed       Communication Communication Communication: No difficulties   Cognition Arousal/Alertness: Awake/alert Behavior During Therapy: WFL for tasks assessed/performed Overall Cognitive Status: Within Functional Limits for tasks assessed                                     General Comments  Only noted deficit tingling in L arm    Exercises Exercises: Other exercises Other Exercises Other Exercises: Pt instructed in falls prevention, stroke education, d/c recs Other Exercises: LBD, sup<>sit, sit<>stand, simulated UBD, sitting/standing balnace/tolerance, ~50 ft in room mobility   Shoulder Instructions      Home Living Family/patient expects to be discharged to:: Private residence Living Arrangements: Alone Available Help at Discharge: Family (daughter in school, home for breaks) Type of Home: House (town home) Home Access: Level entry     Home Layout: One level  Home Equipment: None          Prior Functioning/Environment Level of Independence: Independent        Comments: Works full time in Public relations account executive, drives        OT Problem List: Impaired sensation         OT Goals(Current goals can be found in the care plan section) Acute Rehab OT Goals Patient Stated Goal: To return home OT Goal Formulation: With patient Time For Goal Achievement: 08/05/20 Potential to Achieve Goals: Good   AM-PAC OT "6 Clicks" Daily Activity     Outcome Measure Help from another person eating meals?: None Help from another person taking care of personal grooming?: None Help from another person toileting, which includes using toliet, bedpan, or urinal?: None Help from another person bathing (including washing, rinsing, drying)?: None Help from  another person to put on and taking off regular upper body clothing?: None Help from another person to put on and taking off regular lower body clothing?: None 6 Click Score: 24   End of Session    Activity Tolerance: Patient tolerated treatment well Patient left: in bed;with call bell/phone within reach  OT Visit Diagnosis: Other symptoms and signs involving the nervous system (T41.962)                Time: 2297-9892 OT Time Calculation (min): 7 min Charges:  OT General Charges $OT Visit: 1 Visit OT Evaluation $OT Eval Low Complexity: 1 Low  Alicia Perez, M.S. OTR/L  07/22/20, 9:56 AM  ascom [phone]

## 2020-07-22 NOTE — ED Notes (Signed)
Dr. Sharl Ma at bedside at this time.

## 2020-07-22 NOTE — ED Notes (Signed)
Per Mauro Kaufmann, MD pt is to be discharged post ECHO performed at bedside. RN made aware as well as bed placement and nursing supervisor. AVS to be printed off and education to be provided by assigned RN as well as questions and follow up care.

## 2020-07-22 NOTE — Discharge Summary (Addendum)
Physician Discharge Summary  Claudetta Sallie WFU:[id number] DOB: [birth year] DOA: 07/21/2020  PCP: Armando Gang, FNP  Admit date: 07/21/2020 Discharge date: 07/22/2020  Time spent: 60 minutes  Recommendations for Outpatient Follow-up:  Follow-up neurology in 4 weeks  Discharge Diagnoses:  Active Problems:   Acute CVA (cerebrovascular accident) [facility])   Hypertensive urgency   Discharge Condition: Stable  Diet recommendation: Heart healthy diet  Filed Weights   07/21/20 1713  Weight: 79.4 kg    History of present illness:  40 year old female with history of hypertension, presented to the ED with complaints of numbness, weakness of left arm and leg around 10 AM on the day of presentation.  Patient was last known well on 3 AM on Sunday.  Denied headache or visual disturbance.  In the ED CT head was negative, MRI brain showed 1 cm acute ischemic nonhemorrhagic right thalamic infarct.  MRA brain did not show large vessel occlusion.  Hospital Course:   Acute thalamic infarct-patient was outside TPA window.  MRI brain showed 1 cm acute ischemic nonhemorrhagic right thalamic infarct.  No large vessel occlusion noted.  Patient started on aspirin 81 mg p.o. daily.  Neurology saw the patient.  Carotid ultrasound showed no significant stenosis.  Transthoracic echocardiogram will be done before patient is discharged home.  She will follow up with neurology in 4 weeks.  Patient has  Nexplanon implant, which is estrogen implant.  This is a risk factor for stroke.  I have told patient to follow-up with her PCP/GYN and get the implant removed.  Patient has no significant residual deficits.  She wants to go home.  Transthoracic echocardiogram will be obtained before she goes.  Results of transthoracic echocardiogram can be followed by her PCP.  Patient will call PCP to make an appointment and follow-up on the results of echocardiogram.  Hyperlipidemia-LDL is 182, dose of Lipitor increased to 40 mg p.o.  daily.  Hypertension-continue HCTZ/lisinopril.  She is out of window for permissive hypertension as per neurology.    Echocardiogram  Carotid ultrasound  Consultations:  Neurology  Discharge Exam: Vitals:   07/22/20 1158 07/22/20 1641  BP: (!) 158/104 (!) 164/107  Pulse: 95 94  Resp: 16 16  Temp:    SpO2: 95% 96%    General: Appears in no acute distress Cardiovascular: S1-S2, regular Respiratory: Clear to auscultation bilaterally  Discharge Instructions   Discharge Instructions    Diet - low sodium heart healthy   Complete by: As directed    Increase activity slowly   Complete by: As directed      Allergies as of 07/22/2020      Reactions   Codeine Hives   Penicillins Hives      Medication List    STOP taking these medications   Nexplanon 68 MG Impl implant Generic drug: etonogestrel     TAKE these medications   aspirin 81 MG EC tablet Take 1 tablet (81 mg total) by mouth daily. Swallow whole. Start taking on: July 23, 2020   atorvastatin 40 MG tablet Commonly known as: Lipitor Take 1 tablet (40 mg total) by mouth daily. What changed:   medication strength  how much to take   lisinopril-hydrochlorothiazide 10-12.5 MG tablet Commonly known as: ZESTORETIC Take 1 tablet by mouth daily.   omeprazole 40 MG capsule Commonly known as: PRILOSEC Take 1 capsule by mouth daily as needed.      Allergies  Allergen Reactions  . Codeine Hives  . Penicillins Hives  The results of significant diagnostics from this hospitalization (including imaging, microbiology, ancillary and laboratory) are listed below for reference.    Significant Diagnostic Studies: CT HEAD WO CONTRAST  Result Date: 07/21/2020 CLINICAL DATA:  Neuro deficit, acute stroke suspected. EXAM: CT HEAD WITHOUT CONTRAST TECHNIQUE: Contiguous axial images were obtained from the base of the skull through the vertex without intravenous contrast. COMPARISON:  None. FINDINGS: Brain: No  evidence of acute large vascular territory infarction, hemorrhage, hydrocephalus, extra-axial collection or mass lesion/mass effect. Vascular: No hyperdense vessel identified. Skull: No acute fracture Sinuses/Orbits: Mucosal thickening of the right sphenoid sinus. Otherwise, sinuses are clear. No acute orbital abnormality. Other: No mastoid effusions. IMPRESSION: No evidence of acute intracranial abnormality. Electronically Signed   By: Feliberto HartsFrederick S Jones MD   On: 07/21/2020 18:37   MR ANGIO HEAD WO CONTRAST  Result Date: 07/21/2020 CLINICAL DATA:  Initial evaluation for neuro deficit, stroke suspected. EXAM: MRI HEAD WITHOUT CONTRAST MRA HEAD WITHOUT CONTRAST TECHNIQUE: Multiplanar, multiecho pulse sequences of the brain and surrounding structures were obtained without intravenous contrast. Angiographic images of the head were obtained using MRA technique without contrast. COMPARISON:  Prior CT from earlier the same day. FINDINGS: MRI HEAD FINDINGS Brain: Cerebral volume within normal limits. No focal parenchymal signal abnormality or significant cerebral white matter disease. 1 cm focus of restricted diffusion seen involving the right thalamus, consistent with an acute ischemic infarct. No associated hemorrhage or mass effect. No other diffusion abnormality to suggest acute or subacute ischemia. Gray-white matter differentiation otherwise maintained. No other areas of encephalomalacia to suggest chronic cortical infarction. No foci of susceptibility artifact to suggest acute or chronic intracranial hemorrhage. No mass lesion, midline shift or mass effect. No hydrocephalus or extra-axial fluid collection. Pituitary gland suprasellar region normal. Midline structures intact. Vascular: Major intracranial vascular flow voids are maintained. Skull and upper cervical spine: Craniocervical junction normal. Bone marrow signal intensity within normal limits. No scalp soft tissue abnormality. Sinuses/Orbits: Globes and  orbital soft tissues within normal limits. Right sphenoid sinus retention cyst noted. Paranasal sinuses are otherwise clear. No mastoid effusion. Inner ear structures grossly normal. Other: None. MRA HEAD FINDINGS ANTERIOR CIRCULATION: Both internal carotid arteries widely patent to the termini without stenosis. A1 segments widely patent. Normal anterior communicating artery complex. Both anterior cerebral arteries widely patent to their distal aspects without stenosis. No M1 stenosis or occlusion. Normal MCA bifurcations. Distal MCA branches well perfused and symmetric. POSTERIOR CIRCULATION: Both V4 segments patent to the vertebrobasilar junction without stenosis. Both PICA origins patent and normal. Basilar widely patent to its distal aspect without stenosis. Superior cerebellar arteries patent bilaterally. Both PCAs primarily supplied via the basilar and are well perfused to there distal aspects. No intracranial aneurysm. IMPRESSION: MRI HEAD IMPRESSION: 1. 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 2. Otherwise normal brain MRI. MRA HEAD IMPRESSION: Normal intracranial MRA. No large vessel occlusion, hemodynamically significant stenosis, or other acute vascular abnormality. Electronically Signed   By: Rise MuBenjamin  McClintock M.D.   On: 07/21/2020 22:41   MR BRAIN WO CONTRAST  Result Date: 07/21/2020 CLINICAL DATA:  Initial evaluation for neuro deficit, stroke suspected. EXAM: MRI HEAD WITHOUT CONTRAST MRA HEAD WITHOUT CONTRAST TECHNIQUE: Multiplanar, multiecho pulse sequences of the brain and surrounding structures were obtained without intravenous contrast. Angiographic images of the head were obtained using MRA technique without contrast. COMPARISON:  Prior CT from earlier the same day. FINDINGS: MRI HEAD FINDINGS Brain: Cerebral volume within normal limits. No focal parenchymal signal abnormality or significant  cerebral white matter disease. 1 cm focus of restricted diffusion seen involving  the right thalamus, consistent with an acute ischemic infarct. No associated hemorrhage or mass effect. No other diffusion abnormality to suggest acute or subacute ischemia. Gray-white matter differentiation otherwise maintained. No other areas of encephalomalacia to suggest chronic cortical infarction. No foci of susceptibility artifact to suggest acute or chronic intracranial hemorrhage. No mass lesion, midline shift or mass effect. No hydrocephalus or extra-axial fluid collection. Pituitary gland suprasellar region normal. Midline structures intact. Vascular: Major intracranial vascular flow voids are maintained. Skull and upper cervical spine: Craniocervical junction normal. Bone marrow signal intensity within normal limits. No scalp soft tissue abnormality. Sinuses/Orbits: Globes and orbital soft tissues within normal limits. Right sphenoid sinus retention cyst noted. Paranasal sinuses are otherwise clear. No mastoid effusion. Inner ear structures grossly normal. Other: None. MRA HEAD FINDINGS ANTERIOR CIRCULATION: Both internal carotid arteries widely patent to the termini without stenosis. A1 segments widely patent. Normal anterior communicating artery complex. Both anterior cerebral arteries widely patent to their distal aspects without stenosis. No M1 stenosis or occlusion. Normal MCA bifurcations. Distal MCA branches well perfused and symmetric. POSTERIOR CIRCULATION: Both V4 segments patent to the vertebrobasilar junction without stenosis. Both PICA origins patent and normal. Basilar widely patent to its distal aspect without stenosis. Superior cerebellar arteries patent bilaterally. Both PCAs primarily supplied via the basilar and are well perfused to there distal aspects. No intracranial aneurysm. IMPRESSION: MRI HEAD IMPRESSION: 1. 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 2. Otherwise normal brain MRI. MRA HEAD IMPRESSION: Normal intracranial MRA. No large vessel occlusion,  hemodynamically significant stenosis, or other acute vascular abnormality. Electronically Signed   By: Rise Mu M.D.   On: 07/21/2020 22:41   MR Cervical Spine Wo Contrast  Result Date: 07/21/2020 CLINICAL DATA:  Initial evaluation for acute left-sided numbness and weakness. EXAM: MRI CERVICAL SPINE WITHOUT CONTRAST TECHNIQUE: Multiplanar, multisequence MR imaging of the cervical spine was performed. No intravenous contrast was administered. COMPARISON:  None available. FINDINGS: Alignment: Straightening with mild reversal of the normal cervical lordosis. No listhesis. Vertebrae: Vertebral body height well maintained without acute or chronic fracture. Bone marrow signal intensity within normal limits. No discrete or worrisome osseous lesions. No abnormal marrow edema. Cord: Normal signal and morphology. Posterior Fossa, vertebral arteries, paraspinal tissues: Visualized brain and posterior fossa within normal limits. Craniocervical junction normal. Paraspinous and prevertebral soft tissues within normal limits. Normal intravascular flow voids seen within the vertebral arteries bilaterally. Disc levels: C2-C3: Unremarkable. C3-C4:  Unremarkable. C4-C5: Mild annular disc bulge. No spinal stenosis. Foramina remain patent. C5-C6: Mild annular disc bulge. No spinal stenosis. Foramina remain patent. C6-C7: Minimal annular disc bulge. No spinal stenosis. Foramina remain patent. C7-T1:  Unremarkable. Visualized upper thoracic spine demonstrates no significant finding. IMPRESSION: 1. No acute abnormality within the cervical spine. 2. Mild noncompressive disc bulging at C4-5 through C6-7 without stenosis or neural impingement. Electronically Signed   By: Rise Mu M.D.   On: 07/21/2020 22:44   US Carotid Bilateral (at [facility] and [facility] only)  Result Date: 07/22/2020 CLINICAL DATA:  Stroke. EXAM: BILATERAL CAROTID DUPLEX ULTRASOUND TECHNIQUE: Wallace Cullens scale imaging, color Doppler and duplex ultrasound were  performed of bilateral carotid and vertebral arteries in the neck. COMPARISON:  None. FINDINGS: Criteria: Quantification of carotid stenosis is based on velocity parameters that correlate the residual internal carotid diameter with NASCET-based stenosis levels, using the diameter of the distal internal carotid lumen as the denominator for stenosis measurement. The  following velocity measurements were obtained: RIGHT ICA: 72/30 cm/sec CCA: 72/26 cm/sec SYSTOLIC ICA/CCA RATIO:  1.0 ECA: 67 cm/sec LEFT ICA: 124/58 cm/sec CCA: 68/24 cm/sec SYSTOLIC ICA/CCA RATIO:  1.8 ECA: 35 cm/sec RIGHT CAROTID ARTERY: Normal caliber without significant atherosclerotic change. Homogeneous normal flow demonstrated on color flow Doppler images with normal spectral flow velocity waveform pattern. RIGHT VERTEBRAL ARTERY:  Normal antegrade flow direction. LEFT CAROTID ARTERY: Normal caliber without significant atherosclerotic change. Homogeneous normal flow demonstrated on color flow Doppler images with normal spectral flow velocity waveform pattern. LEFT VERTEBRAL ARTERY:  Normal antegrade flow direction. IMPRESSION: No evidence of hemodynamically significant stenosis of either right or left internal carotid artery. Electronically Signed   By: Burman Nieves M.D.   On: 07/22/2020 02:00    Microbiology: Recent Results (from the past 240 hour(s))  Resp Panel by RT-PCR (Flu A&B, Covid) Nasopharyngeal Swab     Status: None   Collection Time: 07/21/20 11:15 PM   Specimen: Nasopharyngeal Swab; Nasopharyngeal(NP) swabs in vial transport medium  Result Value Ref Range Status   SARS Coronavirus 2 by RT PCR NEGATIVE NEGATIVE Final    Comment: (NOTE) SARS-CoV-2 target nucleic acids are NOT DETECTED.  The SARS-CoV-2 RNA is generally detectable in upper respiratory specimens during the acute phase of infection. The lowest concentration of SARS-CoV-2 viral copies this assay can detect is 138 copies/mL. A negative result does not  preclude SARS-Cov-2 infection and should not be used as the sole basis for treatment or other patient management decisions. A negative result may occur with  improper specimen collection/handling, submission of specimen other than nasopharyngeal swab, presence of viral mutation(s) within the areas targeted by this assay, and inadequate number of viral copies(<138 copies/mL). A negative result must be combined with clinical observations, patient history, and epidemiological information. The expected result is Negative.  Fact Sheet for Patients:  BloggerCourse.com  Fact Sheet for Healthcare Providers:  SeriousBroker.it  This test is no t yet approved or cleared by the [place] FDA and  has been authorized for detection and/or diagnosis of SARS-CoV-2 by FDA under an Emergency Use Authorization (EUA). This EUA will remain  in effect (meaning this test can be used) for the duration of the COVID-19 declaration under Section 564(b)(1) of the Act, 21 U.S.C.section 360bbb-3(b)(1), unless the authorization is terminated  or revoked sooner.       Influenza A by PCR NEGATIVE NEGATIVE Final   Influenza B by PCR NEGATIVE NEGATIVE Final    Comment: (NOTE) The Xpert Xpress SARS-CoV-2/FLU/RSV plus assay is intended as an aid in the diagnosis of influenza from Nasopharyngeal swab specimens and should not be used as a sole basis for treatment. Nasal washings and aspirates are unacceptable for Xpert Xpress SARS-CoV-2/FLU/RSV testing.  Fact Sheet for Patients: BloggerCourse.com  Fact Sheet for Healthcare Providers: SeriousBroker.it  This test is not yet approved or cleared by the [place] FDA and has been authorized for detection and/or diagnosis of SARS-CoV-2 by FDA under an Emergency Use Authorization (EUA). This EUA will remain in effect (meaning this test can be used) for the  duration of the COVID-19 declaration under Section 564(b)(1) of the Act, 21 U.S.C. section 360bbb-3(b)(1), unless the authorization is terminated or revoked.  Performed at [facility], [address] [place], [place] [address]      Labs: Basic Metabolic Panel: Recent Labs  Lab 07/21/20 1731 07/21/20 2354  NA 140  --   K 3.5  --   CL 106  --  CO2 26  --   GLUCOSE 117*  --   BUN 12  --   CREATININE 0.83 0.86  CALCIUM 9.4  --    Liver Function Tests: Recent Labs  Lab 07/21/20 1731  AST 17  ALT 18  ALKPHOS 60  BILITOT 0.8  PROT 7.5  ALBUMIN 4.1   No results for input(s): LIPASE, AMYLASE in the last 168 hours. No results for input(s): AMMONIA in the last 168 hours. CBC: Recent Labs  Lab 07/21/20 1731 07/21/20 2354  WBC 9.4 9.1  NEUTROABS 4.4  --   HGB 14.4 13.8  HCT 42.8 41.3  MCV 79.0* 78.7*  PLT 341 320   Cardiac Enzymes: No results for input(s): CKTOTAL, CKMB, CKMBINDEX, TROPONINI in the last 168 hours. BNP: BNP (last 3 results) No results for input(s): BNP in the last 8760 hours.  ProBNP (last 3 results) No results for input(s): PROBNP in the last 8760 hours.  CBG: Recent Labs  Lab 07/21/20 1716  GLUCAP 129*       Signed:  Meredeth Ide MD.  [facility] 07/22/2020, 6:13 PM

## 2020-07-22 NOTE — Progress Notes (Signed)
*  PRELIMINARY RESULTS* Echocardiogram 2D Echocardiogram has been performed.  Alicia Perez 07/22/2020, 7:40 PM

## 2020-07-22 NOTE — ED Notes (Signed)
ECHO completed at bedside, per MD, Dr. Sharl Ma,  Patient to be discharged. Reviewed education at length, pt able to provide teach back and verbalized understanding. Answered all questions. Vitals WNL. Pt ambulatory out of department.

## 2020-07-22 NOTE — Progress Notes (Signed)
PT Cancellation Note  Patient Details Name: Alicia Perez MRN: [id number] DOB: [birth year]   Cancelled Treatment:    Reason Eval/Treat Not Completed: PT screened, no needs identified, will sign off. Per OT who saw patient this morning, she is independent with mobility and does not have any needs at this time. Will sign off.    Alicia Perez 07/22/2020, 11:22 AM

## 2020-07-22 NOTE — Consult Note (Addendum)
Referring Physician: Dr. Sharl MaLama    Chief Complaint: Left sided numbness  HPI: Alicia Perez is an 40 y.o. female who presented to the ED yesterday evening with acute onset of left sided face, arm and leg numbness. LKN was at about 3 AM on Sunday when she returned home from watching a sporting event and went to sleep. On awakening at 10:00 AM, she noticed that her left foot felt numb. The numbness then progressed up her leg and to her left arm and face. She denies having any weakness or vision changes. She was out of the time window for tPA when she arrived to the ED. Her symptoms and signs were not consistent with LVO per EDP.   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   EKG: Sinus tachycardia Minimal voltage criteria for LVH, may be normal variant ( R in aVL ) Anterior infarct , age undetermined Abnormal ECG  TTE: Pending  Carotid ultrasound: No evidence of hemodynamically significant stenosis of either right or left internal carotid artery.  LSN: 1000 on Sunday tPA Given: No: Out of the time window  Past Medical History:  Diagnosis Date  . Hypertension     History reviewed. No pertinent surgical history.  History reviewed. No pertinent family history. Social History:  reports that she has never smoked. She has never used smokeless tobacco. She reports current alcohol use. She reports previous drug use.  Allergies:  Allergies  Allergen Reactions  . Codeine Hives  . Penicillins Hives    Medications:  No current facility-administered medications on file prior to encounter.   Current Outpatient Medications on File Prior to Encounter  Medication Sig Dispense Refill  . atorvastatin (LIPITOR) 20 MG tablet Take 20 mg by mouth daily.    Marland Kitchen. lisinopril-hydrochlorothiazide (ZESTORETIC) 10-12.5 MG tablet Take 1 tablet by mouth daily.    Marland Kitchen. NEXPLANON 68 MG IMPL implant     . omeprazole (PRILOSEC) 40 MG capsule Take 1 capsule by mouth daily as  needed.      Inpatient Scheduled: .  stroke: mapping our early stages of recovery book   Does not apply Once  . aspirin EC  81 mg Oral Daily  . atorvastatin  40 mg Oral Daily  . enoxaparin (LOVENOX) injection  40 mg Subcutaneous Q24H    ROS: No headache, speech deficit, changes in vision, dysphagia or right sided symptoms.   Physical Examination: Blood pressure (!) 153/91, pulse 88, temperature 98.2 F (36.8 C), temperature source Oral, resp. rate 20, height 5\' 3"  (1.6 m), weight 79.4 kg, SpO2 97 %.  HEENT: [place]/AT Lungs: Respirations unlabored Ext: No edema  Neurologic Examination: Mental Status:  Alert, oriented, thought content appropriate.  Speech fluent without evidence of aphasia.  Able to follow all commands without difficulty. No dysarthria.  Cranial Nerves: II:  Visual fields grossly normal. Fixates and tracks normally.  III,IV, VI: EOMI. No nystagmus. No ptosis.  V,VII: Smile symmetric, facial temp and FT sensation are subjectively normal bilaterally VIII: Hearing intact to conversation IX,X: No hoarseness or hypophonia XI: Symmetric XII: Midline tongue extension  Motor: RUE and RLE 5/5 LUE 4+/5 deltoid, otherwise 5/5 LLE 5/5 Pronator drift to LLE with testing of [place] Sensory: Temp and light touch intact throughout, bilaterally. No asymmetry per patient.  Deep Tendon Reflexes:  2+ bilateral upper and lower extremities.  Plantars: Right: downgoing   Left: downgoing Cerebellar: Subtle ataxia with FNF and H-S on the left.  Gait: Ambulation deferred. Moves from supine  position to sitting with own power. No truncal instability noted.   Results for orders placed or performed during the hospital encounter of 07/21/20 (from the past 48 hour(s))  CBG monitoring, ED     Status: Abnormal   Collection Time: 07/21/20  5:16 PM  Result Value Ref Range   Glucose-Capillary 129 (H) 70 - 99 mg/dL    Comment: Glucose reference range applies only to samples taken after fasting for  at least 8 hours.  Protime-INR     Status: None   Collection Time: 07/21/20  5:31 PM  Result Value Ref Range   Prothrombin Time 12.5 11.4 - 15.2 seconds   INR 1.0 0.8 - 1.2    Comment: (NOTE) INR goal varies based on device and disease states. Performed at [facility], [address] [place], [place] [address]   APTT     Status: None   Collection Time: 07/21/20  5:31 PM  Result Value Ref Range   aPTT 27 24 - 36 seconds    Comment: Performed at [facility], [address] [place], [place] [address]  CBC     Status: Abnormal   Collection Time: 07/21/20  5:31 PM  Result Value Ref Range   WBC 9.4 4.0 - 10.5 K/uL   RBC 5.42 (H) 3.87 - 5.11 MIL/uL   Hemoglobin 14.4 12.0 - 15.0 g/dL   HCT 09.8 11.9 - 14.7 %   MCV 79.0 (L) 80.0 - 100.0 fL   MCH 26.6 26.0 - 34.0 pg   MCHC 33.6 30.0 - 36.0 g/dL   RDW 82.9 56.2 - 13.0 %   Platelets 341 150 - 400 K/uL   nRBC 0.0 0.0 - 0.2 %    Comment: Performed at [facility], [address] [place], [place] [address]  Differential     Status: None   Collection Time: 07/21/20  5:31 PM  Result Value Ref Range   Neutrophils Relative % 47 %   Neutro Abs 4.4 1.7 - 7.7 K/uL   Lymphocytes Relative 42 %   Lymphs Abs 4.0 0.7 - 4.0 K/uL   Monocytes Relative 9 %   Monocytes Absolute 0.8 0.1 - 1.0 K/uL   Eosinophils Relative 1 %   Eosinophils Absolute 0.1 0.0 - 0.5 K/uL   Basophils Relative 1 %   Basophils Absolute 0.1 0.0 - 0.1 K/uL   Immature Granulocytes 0 %   Abs Immature Granulocytes 0.02 0.00 - 0.07 K/uL    Comment: Performed at [facility], [address] [place], [place] [address]  Comprehensive metabolic panel     Status: Abnormal   Collection Time: 07/21/20  5:31 PM  Result Value Ref Range   Sodium 140 135 - 145 mmol/L   Potassium 3.5 3.5 - 5.1 mmol/L   Chloride 106 98 - 111 mmol/L   CO2 26 22 - 32 mmol/L   Glucose, Bld 117 (H) 70 - 99 mg/dL    Comment: Glucose reference range applies only to  samples taken after fasting for at least 8 hours.   BUN 12 6 - 20 mg/dL   Creatinine, Ser 9.52 0.44 - 1.00 mg/dL   Calcium 9.4 8.9 - 84.1 mg/dL   Total Protein 7.5 6.5 - 8.1 g/dL   Albumin 4.1 3.5 - 5.0 g/dL   AST 17 15 - 41 U/L   ALT 18 0 - 44 U/L   Alkaline Phosphatase 60 38 - 126 U/L   Total Bilirubin 0.8 0.3 - 1.2 mg/dL  GFR, Estimated >60 >60 mL/min    Comment: (NOTE) Calculated using the CKD-EPI Creatinine Equation (2021)    Anion gap 8 5 - 15    Comment: Performed at [facility], [address] [place], [place] [address]  POC urine preg, ED     Status: None   Collection Time: 07/21/20  5:32 PM  Result Value Ref Range   Preg Test, Ur NEGATIVE NEGATIVE    Comment:        THE SENSITIVITY OF THIS METHODOLOGY IS >24 mIU/mL   Resp Panel by RT-PCR (Flu A&B, Covid) Nasopharyngeal Swab     Status: None   Collection Time: 07/21/20 11:15 PM   Specimen: Nasopharyngeal Swab; Nasopharyngeal(NP) swabs in vial transport medium  Result Value Ref Range   SARS Coronavirus 2 by RT PCR NEGATIVE NEGATIVE    Comment: (NOTE) SARS-CoV-2 target nucleic acids are NOT DETECTED.  The SARS-CoV-2 RNA is generally detectable in upper respiratory specimens during the acute phase of infection. The lowest concentration of SARS-CoV-2 viral copies this assay can detect is 138 copies/mL. A negative result does not preclude SARS-Cov-2 infection and should not be used as the sole basis for treatment or other patient management decisions. A negative result may occur with  improper specimen collection/handling, submission of specimen other than nasopharyngeal swab, presence of viral mutation(s) within the areas targeted by this assay, and inadequate number of viral copies(<138 copies/mL). A negative result must be combined with clinical observations, patient history, and epidemiological information. The expected result is Negative.  Fact Sheet for Patients:   BloggerCourse.com  Fact Sheet for Healthcare Providers:  SeriousBroker.it  This test is no t yet approved or cleared by the [place] FDA and  has been authorized for detection and/or diagnosis of SARS-CoV-2 by FDA under an Emergency Use Authorization (EUA). This EUA will remain  in effect (meaning this test can be used) for the duration of the COVID-19 declaration under Section 564(b)(1) of the Act, 21 U.S.C.section 360bbb-3(b)(1), unless the authorization is terminated  or revoked sooner.       Influenza A by PCR NEGATIVE NEGATIVE   Influenza B by PCR NEGATIVE NEGATIVE    Comment: (NOTE) The Xpert Xpress SARS-CoV-2/FLU/RSV plus assay is intended as an aid in the diagnosis of influenza from Nasopharyngeal swab specimens and should not be used as a sole basis for treatment. Nasal washings and aspirates are unacceptable for Xpert Xpress SARS-CoV-2/FLU/RSV testing.  Fact Sheet for Patients: BloggerCourse.com  Fact Sheet for Healthcare Providers: SeriousBroker.it  This test is not yet approved or cleared by the [place] FDA and has been authorized for detection and/or diagnosis of SARS-CoV-2 by FDA under an Emergency Use Authorization (EUA). This EUA will remain in effect (meaning this test can be used) for the duration of the COVID-19 declaration under Section 564(b)(1) of the Act, 21 U.S.C. section 360bbb-3(b)(1), unless the authorization is terminated or revoked.  Performed at [facility], [address] [place], [place] [address]   CBC     Status: Abnormal   Collection Time: 07/21/20 11:54 PM  Result Value Ref Range   WBC 9.1 4.0 - 10.5 K/uL   RBC 5.25 (H) 3.87 - 5.11 MIL/uL   Hemoglobin 13.8 12.0 - 15.0 g/dL   HCT 33.0 07.6 - 22.6 %   MCV 78.7 (L) 80.0 - 100.0 fL   MCH 26.3 26.0 - 34.0 pg   MCHC 33.4 30.0 - 36.0 g/dL   RDW 33.3 54.5 - 62.5 %  Platelets 320 150 - 400 K/uL   nRBC 0.0 0.0 - 0.2 %    Comment: Performed at [facility], [address] [place], [place] [address]  Creatinine, serum     Status: None   Collection Time: 07/21/20 11:54 PM  Result Value Ref Range   Creatinine, Ser 0.86 0.44 - 1.00 mg/dL   GFR, Estimated >60 >45 mL/min    Comment: (NOTE) Calculated using the CKD-EPI Creatinine Equation (2021) Performed at [facility], [address] [place], [place] [address]   HIV Antibody (routine testing w rflx)     Status: None   Collection Time: 07/22/20  1:41 AM  Result Value Ref Range   HIV Screen 4th Generation wRfx Non Reactive Non Reactive    Comment: Performed at [facility], [address] [place], [place] [address]  Lipid panel     Status: Abnormal   Collection Time: 07/22/20  1:41 AM  Result Value Ref Range   Cholesterol 252 (H) 0 - 200 mg/dL   Triglycerides 97 <829 mg/dL   HDL 51 >56 mg/dL   Total CHOL/HDL Ratio 4.9 RATIO   VLDL 19 0 - 40 mg/dL   LDL Cholesterol 213 (H) 0 - 99 mg/dL    Comment:        Total Cholesterol/HDL:CHD Risk Coronary Heart Disease Risk Table                     Men   Women  1/2 Average Risk   3.4   3.3  Average Risk       5.0   4.4  2 X Average Risk   9.6   7.1  3 X Average Risk  23.4   11.0        Use the calculated Patient Ratio above and the CHD Risk Table to determine the patient's CHD Risk.        ATP III CLASSIFICATION (LDL):  <100     mg/dL   Optimal  086-578  mg/dL   Near or Above                    Optimal  130-159  mg/dL   Borderline  469-629  mg/dL   High  >528     mg/dL   Very High Performed at [facility], [address] [place], [place] [address]    CT HEAD WO CONTRAST  Result Date: 07/21/2020 CLINICAL DATA:  Neuro deficit, acute stroke suspected. EXAM: CT HEAD WITHOUT CONTRAST TECHNIQUE: Contiguous axial images were obtained from the base of the skull through the vertex without intravenous contrast.  COMPARISON:  None. FINDINGS: Brain: No evidence of acute large vascular territory infarction, hemorrhage, hydrocephalus, extra-axial collection or mass lesion/mass effect. Vascular: No hyperdense vessel identified. Skull: No acute fracture Sinuses/Orbits: Mucosal thickening of the right sphenoid sinus. Otherwise, sinuses are clear. No acute orbital abnormality. Other: No mastoid effusions. IMPRESSION: No evidence of acute intracranial abnormality. Electronically Signed   By: Feliberto Harts MD   On: 07/21/2020 18:37   MR ANGIO HEAD WO CONTRAST  Result Date: 07/21/2020 CLINICAL DATA:  Initial evaluation for neuro deficit, stroke suspected. EXAM: MRI HEAD WITHOUT CONTRAST MRA HEAD WITHOUT CONTRAST TECHNIQUE: Multiplanar, multiecho pulse sequences of the brain and surrounding structures were obtained without intravenous contrast. Angiographic images of the head were obtained using MRA technique without contrast. COMPARISON:  Prior CT from earlier the same day. FINDINGS: MRI HEAD FINDINGS Brain: Cerebral volume  within normal limits. No focal parenchymal signal abnormality or significant cerebral white matter disease. 1 cm focus of restricted diffusion seen involving the right thalamus, consistent with an acute ischemic infarct. No associated hemorrhage or mass effect. No other diffusion abnormality to suggest acute or subacute ischemia. Gray-white matter differentiation otherwise maintained. No other areas of encephalomalacia to suggest chronic cortical infarction. No foci of susceptibility artifact to suggest acute or chronic intracranial hemorrhage. No mass lesion, midline shift or mass effect. No hydrocephalus or extra-axial fluid collection. Pituitary gland suprasellar region normal. Midline structures intact. Vascular: Major intracranial vascular flow voids are maintained. Skull and upper cervical spine: Craniocervical junction normal. Bone marrow signal intensity within normal limits. No scalp soft tissue  abnormality. Sinuses/Orbits: Globes and orbital soft tissues within normal limits. Right sphenoid sinus retention cyst noted. Paranasal sinuses are otherwise clear. No mastoid effusion. Inner ear structures grossly normal. Other: None. MRA HEAD FINDINGS ANTERIOR CIRCULATION: Both internal carotid arteries widely patent to the termini without stenosis. A1 segments widely patent. Normal anterior communicating artery complex. Both anterior cerebral arteries widely patent to their distal aspects without stenosis. No M1 stenosis or occlusion. Normal MCA bifurcations. Distal MCA branches well perfused and symmetric. POSTERIOR CIRCULATION: Both V4 segments patent to the vertebrobasilar junction without stenosis. Both PICA origins patent and normal. Basilar widely patent to its distal aspect without stenosis. Superior cerebellar arteries patent bilaterally. Both PCAs primarily supplied via the basilar and are well perfused to there distal aspects. No intracranial aneurysm. IMPRESSION: MRI HEAD IMPRESSION: 1. 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 2. Otherwise normal brain MRI. MRA HEAD IMPRESSION: Normal intracranial MRA. No large vessel occlusion, hemodynamically significant stenosis, or other acute vascular abnormality. Electronically Signed   By: Rise Mu M.D.   On: 07/21/2020 22:41   MR BRAIN WO CONTRAST  Result Date: 07/21/2020 CLINICAL DATA:  Initial evaluation for neuro deficit, stroke suspected. EXAM: MRI HEAD WITHOUT CONTRAST MRA HEAD WITHOUT CONTRAST TECHNIQUE: Multiplanar, multiecho pulse sequences of the brain and surrounding structures were obtained without intravenous contrast. Angiographic images of the head were obtained using MRA technique without contrast. COMPARISON:  Prior CT from earlier the same day. FINDINGS: MRI HEAD FINDINGS Brain: Cerebral volume within normal limits. No focal parenchymal signal abnormality or significant cerebral white matter disease. 1 cm focus  of restricted diffusion seen involving the right thalamus, consistent with an acute ischemic infarct. No associated hemorrhage or mass effect. No other diffusion abnormality to suggest acute or subacute ischemia. Gray-white matter differentiation otherwise maintained. No other areas of encephalomalacia to suggest chronic cortical infarction. No foci of susceptibility artifact to suggest acute or chronic intracranial hemorrhage. No mass lesion, midline shift or mass effect. No hydrocephalus or extra-axial fluid collection. Pituitary gland suprasellar region normal. Midline structures intact. Vascular: Major intracranial vascular flow voids are maintained. Skull and upper cervical spine: Craniocervical junction normal. Bone marrow signal intensity within normal limits. No scalp soft tissue abnormality. Sinuses/Orbits: Globes and orbital soft tissues within normal limits. Right sphenoid sinus retention cyst noted. Paranasal sinuses are otherwise clear. No mastoid effusion. Inner ear structures grossly normal. Other: None. MRA HEAD FINDINGS ANTERIOR CIRCULATION: Both internal carotid arteries widely patent to the termini without stenosis. A1 segments widely patent. Normal anterior communicating artery complex. Both anterior cerebral arteries widely patent to their distal aspects without stenosis. No M1 stenosis or occlusion. Normal MCA bifurcations. Distal MCA branches well perfused and symmetric. POSTERIOR CIRCULATION: Both V4 segments patent to the vertebrobasilar junction without stenosis. Both  PICA origins patent and normal. Basilar widely patent to its distal aspect without stenosis. Superior cerebellar arteries patent bilaterally. Both PCAs primarily supplied via the basilar and are well perfused to there distal aspects. No intracranial aneurysm. IMPRESSION: MRI HEAD IMPRESSION: 1. 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 2. Otherwise normal brain MRI. MRA HEAD IMPRESSION: Normal intracranial  MRA. No large vessel occlusion, hemodynamically significant stenosis, or other acute vascular abnormality. Electronically Signed   By: Rise Mu M.D.   On: 07/21/2020 22:41   MR Cervical Spine Wo Contrast  Result Date: 07/21/2020 CLINICAL DATA:  Initial evaluation for acute left-sided numbness and weakness. EXAM: MRI CERVICAL SPINE WITHOUT CONTRAST TECHNIQUE: Multiplanar, multisequence MR imaging of the cervical spine was performed. No intravenous contrast was administered. COMPARISON:  None available. FINDINGS: Alignment: Straightening with mild reversal of the normal cervical lordosis. No listhesis. Vertebrae: Vertebral body height well maintained without acute or chronic fracture. Bone marrow signal intensity within normal limits. No discrete or worrisome osseous lesions. No abnormal marrow edema. Cord: Normal signal and morphology. Posterior Fossa, vertebral arteries, paraspinal tissues: Visualized brain and posterior fossa within normal limits. Craniocervical junction normal. Paraspinous and prevertebral soft tissues within normal limits. Normal intravascular flow voids seen within the vertebral arteries bilaterally. Disc levels: C2-C3: Unremarkable. C3-C4:  Unremarkable. C4-C5: Mild annular disc bulge. No spinal stenosis. Foramina remain patent. C5-C6: Mild annular disc bulge. No spinal stenosis. Foramina remain patent. C6-C7: Minimal annular disc bulge. No spinal stenosis. Foramina remain patent. C7-T1:  Unremarkable. Visualized upper thoracic spine demonstrates no significant finding. IMPRESSION: 1. No acute abnormality within the cervical spine. 2. Mild noncompressive disc bulging at C4-5 through C6-7 without stenosis or neural impingement. Electronically Signed   By: Rise Mu M.D.   On: 07/21/2020 22:44   US Carotid Bilateral (at [facility] and [facility] only)  Result Date: 07/22/2020 CLINICAL DATA:  Stroke. EXAM: BILATERAL CAROTID DUPLEX ULTRASOUND TECHNIQUE: Wallace Cullens scale imaging, color  Doppler and duplex ultrasound were performed of bilateral carotid and vertebral arteries in the neck. COMPARISON:  None. FINDINGS: Criteria: Quantification of carotid stenosis is based on velocity parameters that correlate the residual internal carotid diameter with NASCET-based stenosis levels, using the diameter of the distal internal carotid lumen as the denominator for stenosis measurement. The following velocity measurements were obtained: RIGHT ICA: 72/30 cm/sec CCA: 72/26 cm/sec SYSTOLIC ICA/CCA RATIO:  1.0 ECA: 67 cm/sec LEFT ICA: 124/58 cm/sec CCA: 68/24 cm/sec SYSTOLIC ICA/CCA RATIO:  1.8 ECA: 35 cm/sec RIGHT CAROTID ARTERY: Normal caliber without significant atherosclerotic change. Homogeneous normal flow demonstrated on color flow Doppler images with normal spectral flow velocity waveform pattern. RIGHT VERTEBRAL ARTERY:  Normal antegrade flow direction. LEFT CAROTID ARTERY: Normal caliber without significant atherosclerotic change. Homogeneous normal flow demonstrated on color flow Doppler images with normal spectral flow velocity waveform pattern. LEFT VERTEBRAL ARTERY:  Normal antegrade flow direction. IMPRESSION: No evidence of hemodynamically significant stenosis of either right or left internal carotid artery. Electronically Signed   By: Burman Nieves M.D.   On: 07/22/2020 02:00     Assessment: 40 y.o. female presenting with acute onset of left sided numbness.  1. Exam reveals subtle left hemiataxia and subtle LUE weakness.  2. 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 3. MRA head: Normal.  4. EKG: No atrial fibrillation or flutter. Anterior infarct, age undetermined 5. Carotid ultrasound: No evidence of hemodynamically significant stenosis of either right or left internal carotid artery. 6. TTE: Pending 7. MRI C-spine: No acute  abnormality within the cervical spine. Mild noncompressive disc bulging at C4-5 through C6-7 without stenosis or neural  impingement.  8. HgbA1C elevated at 6.3. Elevated total cholesterol of 252.  9. Stroke Risk Factors - HTN, possible undiagnosed diabetes  Recommendations: 1. PT consult, OT consult, Speech consult 2. Echocardiogram 3. Agree with starting ASA and increasing Lipitor dose 4. Risk factor modification 5. Telemetry monitoring 6. Frequent neuro checks 7. BP management. Out of the permissive HTN time window 8. Outpatient Neurology follow up.    signed: Dr. Caryl Pina  07/22/2020, 11:15 AM

## 2020-07-22 NOTE — ED Notes (Signed)
Pt provided with grape juice per her request. Pt remains A&O x4, call bell within reach, lights dimmed for comfort.

## 2020-07-22 NOTE — ED Notes (Signed)
PT at bedside to work with patient at this time.

## 2020-07-24 LAB — ECHOCARDIOGRAM COMPLETE
AR max vel: 2.04 cm2
AV Peak grad: 6.1 mmHg
Ao pk vel: 1.23 m/s
Area-P 1/2: 4.29 cm2
Height: 63 in
S' Lateral: 3.21 cm
Weight: 2800 oz

## 2020-08-20 ENCOUNTER — Ambulatory Visit (INDEPENDENT_AMBULATORY_CARE_PROVIDER_SITE_OTHER): Payer: Medicaid Other | Admitting: Certified Nurse Midwife

## 2020-08-20 ENCOUNTER — Other Ambulatory Visit: Payer: Self-pay

## 2020-08-20 ENCOUNTER — Encounter: Payer: Self-pay | Admitting: Certified Nurse Midwife

## 2020-08-20 VITALS — BP 151/94 | HR 118 | Resp 16 | Ht 63.0 in | Wt 170.0 lb

## 2020-08-20 DIAGNOSIS — Z3202 Encounter for pregnancy test, result negative: Secondary | ICD-10-CM | POA: Diagnosis not present

## 2020-08-20 DIAGNOSIS — Z30018 Encounter for initial prescription of other contraceptives: Secondary | ICD-10-CM

## 2020-08-20 LAB — POCT URINE PREGNANCY: Preg Test, Ur: NEGATIVE

## 2020-08-20 MED ORDER — MEDROXYPROGESTERONE ACETATE 150 MG/ML IM SUSP: 150.0000 mg | Freq: Once | INTRAMUSCULAR | 3 refills | Status: AC

## 2020-08-20 NOTE — Progress Notes (Signed)
Subjective:    Alicia Perez is a 40 y.o. female who presents for contraception counseling. The patient has no complaints today. The patient is sexually active. Pertinent past medical history: hypertension. PT state she has high blood pressure that resulted in a mild stroke. She had the nexplanon and was told that it need to come out by doctor in the ED so she had it removed by her PCP. She presents today wanting para guard but once we discussed bleeding possibility she declines. Reviewed with pt that progestin only methods considered safe with hypertension.   Menstrual History: OB History    Gravida  1   Para  1   Term  1   Preterm      AB      Living  1     SAB      IAB      Ectopic      Multiple      Live Births                 The following portions of the patient's history were reviewed and updated as appropriate: allergies, current medications, past family history, past medical history, past social history, past surgical history and problem list.  Review of Systems Pertinent items are noted in HPI.   Objective:    No exam performed today, not indicated for conraceptive visit.   Assessment:    40 y.o., starting Depo-Provera injections, no contraindications.   Plan:    All questions answered.  Reviewed all forms of birth control options available including abstinence; fertility period awareness methods; over the counter/barrier methods; progestin only hormonal contraceptive medication including injection,contraceptive implant; hormonal and nonhormonal IUDs; permanent sterilization options including vasectomy and the various tubal sterilization modalities. Risks and benefits reviewed.  Questions were answered.  Information was given to patient to review. Pt said she did depo in the past and would like to go back on it . Orders placed. Pt will return for nurse visit to receive injections. Follow up prn.   Doreene Burke, CNM

## 2020-08-20 NOTE — Patient Instructions (Addendum)
Medroxyprogesterone injection [Contraceptive] What is this medicine? MEDROXYPROGESTERONE (me DROX ee proe JES te rone) contraceptive injections prevent pregnancy. They provide effective birth control for 3 months. Depo-SubQ Provera 104 injection is also used for treating pain related to endometriosis. This medicine may be used for other purposes; ask your health care provider or pharmacist if you have questions. COMMON BRAND NAME(S): Depo-Provera, Depo-subQ Provera 104 What should I tell my health care provider before I take this medicine? They need to know if you have any of these conditions:  asthma  blood clots  breast cancer or family history of breast cancer  depression  diabetes  eating disorder (anorexia nervosa)  heart attack  high blood pressure  HIV infection or AIDS  if you often drink alcohol  kidney disease  liver disease  migraine headaches  osteoporosis, weak bones  seizures  stroke  tobacco smoker  vaginal bleeding  an unusual or allergic reaction to medroxyprogesterone, other hormones, medicines, foods, dyes, or preservatives  pregnant or trying to get pregnant  breast-feeding How should I use this medicine? Depo-Provera CI contraceptive injection is given into a muscle. Depo-subQ Provera 104 injection is given under the skin. It is given by a health care provider in a hospital or clinic setting. The injection is usually given during the first 5 days after the start of a menstrual period or 6 weeks after delivery of a baby. A patient package insert for the product will be given with each prescription and refill. Be sure to read this information carefully each time. The sheet may change often. Talk to your pediatrician regarding the use of this medicine in children. Special care may be needed. These injections have been used in female children who have started having menstrual periods. Overdosage: If you think you have taken too much of this  medicine contact a poison control center or emergency room at once. NOTE: This medicine is only for you. Do not share this medicine with others. What if I miss a dose? Keep appointments for follow-up doses. You must get an injection once every 3 months. It is important not to miss your dose. Call your health care provider if you are unable to keep an appointment. What may interact with this medicine?  antibiotics or medicines for infections, especially rifampin and griseofulvin  antivirals for HIV or hepatitis  aprepitant  armodafinil  bexarotene  bosentan  medicines for seizures like carbamazepine, felbamate, oxcarbazepine, phenytoin, phenobarbital, primidone, topiramate  mitotane  modafinil  St. John's wort This list may not describe all possible interactions. Give your health care provider a list of all the medicines, herbs, non-prescription drugs, or dietary supplements you use. Also tell them if you smoke, drink alcohol, or use illegal drugs. Some items may interact with your medicine. What should I watch for while using this medicine? This drug does not protect you against HIV infection (AIDS) or other sexually transmitted diseases. Use of this product may cause you to lose calcium from your bones. Loss of calcium may cause weak bones (osteoporosis). Only use this product for more than 2 years if other forms of birth control are not right for you. The longer you use this product for birth control the more likely you will be at risk for weak bones. Ask your health care professional how you can keep strong bones. You may have a change in bleeding pattern or irregular periods. Many females stop having periods while taking this drug. If you have received your injections on time, your   chance of being pregnant is very low. If you think you may be pregnant, see your health care professional as soon as possible. Tell your health care professional if you want to get pregnant within the  next year. The effect of this medicine may last a long time after you get your last injection. What side effects may I notice from receiving this medicine? Side effects that you should report to your doctor or health care professional as soon as possible:  allergic reactions like skin rash, itching or hives, swelling of the face, lips, or tongue  blood clot (chest pain; shortness of breath; pain, swelling, or warmth in the leg)  breast tenderness or discharge  changes in emotions or moods  changes in vision  liver injury (dark yellow or brown urine; general ill feeling or flu-like symptoms; loss of appetite, right upper belly pain; unusually weak or tired, yellowing of the eyes or skin)  persistent pain, pus, or bleeding at the injection site  stroke (changes in vision; confusion; trouble speaking or understanding; severe headaches; sudden numbness or weakness of the face, arm or leg; trouble walking; dizziness; loss of balance or coordination)  trouble breathing Side effects that usually do not require medical attention (report to your doctor or health care professional if they continue or are bothersome):  change in sex drive  dizziness  fluid retention  headache  irregular periods, spotting, or absent periods  pain, redness, or irritation at site where injected  stomach pain  weight gain This list may not describe all possible side effects. Call your doctor for medical advice about side effects. You may report side effects to FDA at 1-800-FDA-1088. Where should I keep my medicine? This injection is only given by a health care provider. It will not be stored at home. NOTE: This sheet is a summary. It may not cover all possible information. If you have questions about this medicine, talk to your doctor, pharmacist, or health care provider.  2021 Elsevier/Gold Standard (2019-05-24 10:29:21)  

## 2020-08-23 ENCOUNTER — Ambulatory Visit (INDEPENDENT_AMBULATORY_CARE_PROVIDER_SITE_OTHER): Payer: Medicaid Other

## 2020-08-23 ENCOUNTER — Other Ambulatory Visit: Payer: Self-pay

## 2020-08-23 VITALS — Ht 63.0 in

## 2020-08-23 DIAGNOSIS — Z3042 Encounter for surveillance of injectable contraceptive: Secondary | ICD-10-CM

## 2020-08-23 MED ORDER — MEDROXYPROGESTERONE ACETATE 150 MG/ML IM SUSP
150.0000 mg | Freq: Once | INTRAMUSCULAR | Status: AC
  Administered 2020-08-23: 150 mg via INTRAMUSCULAR

## 2020-08-23 NOTE — Progress Notes (Signed)
Date last pap: 08/19/2020 Last Depo-Provera: first injection after removal of Nexplanon Side Effects if any: n/a Serum HCG indicated? n/a Depo-Provera 150 mg IM given by: Jari Pigg, LPN Next appointment due July 22-Dec 12, 2020

## 2020-09-14 DIAGNOSIS — I6381 Other cerebral infarction due to occlusion or stenosis of small artery: Secondary | ICD-10-CM | POA: Insufficient documentation

## 2020-10-02 ENCOUNTER — Ambulatory Visit: Payer: Self-pay | Admitting: Neurology

## 2020-11-22 ENCOUNTER — Ambulatory Visit (INDEPENDENT_AMBULATORY_CARE_PROVIDER_SITE_OTHER): Payer: Medicaid Other | Admitting: Certified Nurse Midwife

## 2020-11-22 ENCOUNTER — Other Ambulatory Visit: Payer: Self-pay

## 2020-11-22 VITALS — BP 141/95 | HR 103 | Ht 63.0 in | Wt 174.4 lb

## 2020-11-22 DIAGNOSIS — Z3042 Encounter for surveillance of injectable contraceptive: Secondary | ICD-10-CM

## 2020-11-22 MED ORDER — MEDROXYPROGESTERONE ACETATE 150 MG/ML IM SUSP
150.0000 mg | Freq: Once | INTRAMUSCULAR | Status: AC
  Administered 2020-11-22: 150 mg via INTRAMUSCULAR

## 2020-11-22 NOTE — Progress Notes (Signed)
Patient presents for routine depo injection. No adverse reactions from patient, no further questions or concerns voiced from patient.

## 2021-02-14 ENCOUNTER — Ambulatory Visit: Payer: Medicaid Other

## 2021-02-17 ENCOUNTER — Ambulatory Visit: Payer: Medicaid Other

## 2021-02-27 ENCOUNTER — Ambulatory Visit: Payer: Medicaid Other

## 2021-03-03 ENCOUNTER — Ambulatory Visit: Payer: Medicaid Other

## 2021-03-04 ENCOUNTER — Encounter: Payer: Self-pay | Admitting: Certified Nurse Midwife

## 2021-03-06 ENCOUNTER — Ambulatory Visit: Payer: Medicaid Other

## 2021-05-07 ENCOUNTER — Ambulatory Visit (INDEPENDENT_AMBULATORY_CARE_PROVIDER_SITE_OTHER): Payer: Self-pay | Admitting: Certified Nurse Midwife

## 2021-05-07 ENCOUNTER — Other Ambulatory Visit: Payer: Self-pay

## 2021-05-07 ENCOUNTER — Other Ambulatory Visit (HOSPITAL_COMMUNITY)
Admission: RE | Admit: 2021-05-07 | Discharge: 2021-05-07 | Disposition: A | Discharge status: Home / Self Care | Payer: BC Managed Care – PPO | Source: Ambulatory Visit | Attending: Certified Nurse Midwife | Admitting: Certified Nurse Midwife

## 2021-05-07 ENCOUNTER — Encounter: Payer: Self-pay | Admitting: Certified Nurse Midwife

## 2021-05-07 VITALS — BP 128/88 | HR 92 | Ht 63.0 in | Wt 181.8 lb

## 2021-05-07 DIAGNOSIS — Z113 Encounter for screening for infections with a predominantly sexual mode of transmission: Secondary | ICD-10-CM

## 2021-05-07 NOTE — Progress Notes (Signed)
GYN ENCOUNTER NOTE  Subjective:       Alicia Perez is a 41 y.o. G56P1001 female is here for gynecologic evaluation of the following issues:  1. STD screening.     Gynecologic History No LMP recorded (lmp unknown). Patient has had an injection. Contraception: Depo-Provera injections Last Pap: 08/2020. Results were: normal ( per pt) Last mammogram: has not had.    Obstetric History OB History  Gravida Para Term Preterm AB Living  1 1 1     1   SAB IAB Ectopic Multiple Live Births               # Outcome Date GA Lbr Len/2nd Weight Sex Delivery Anes PTL Lv  1 Term             Past Medical History:  Diagnosis Date   Hypertension     History reviewed. No pertinent surgical history.  Current Outpatient Medications on File Prior to Visit  Medication Sig Dispense Refill   aspirin EC 81 MG EC tablet Take 1 tablet (81 mg total) by mouth daily. Swallow whole. 30 tablet 11   atorvastatin (LIPITOR) 40 MG tablet Take 1 tablet (40 mg total) by mouth daily. 30 tablet 11   lisinopril-hydrochlorothiazide (ZESTORETIC) 10-12.5 MG tablet Take 1 tablet by mouth daily.     omeprazole (PRILOSEC) 40 MG capsule Take 1 capsule by mouth daily as needed.     medroxyPROGESTERone (DEPO-PROVERA) 150 MG/ML injection Inject 1 mL (150 mg total) into the muscle once for 1 dose. 1 mL 3   No current facility-administered medications on file prior to visit.    Allergies  Allergen Reactions   Codeine Hives   Penicillins Hives    Social History   Socioeconomic History   Marital status: Single    Spouse name: Not on file   Number of children: Not on file   Years of education: Not on file   Highest education level: Not on file  Occupational History   Not on file  Tobacco Use   Smoking status: Never   Smokeless tobacco: Never  Vaping Use   Vaping Use: Never used  Substance and Sexual Activity   Alcohol use: Yes   Drug use: Not Currently   Sexual activity: Yes    Birth control/protection:  Injection  Other Topics Concern   Not on file  Social History Narrative   Not on file   Social Determinants of Health   Financial Resource Strain: Not on file  Food Insecurity: Not on file  Transportation Needs: Not on file  Physical Activity: Not on file  Stress: Not on file  Social Connections: Not on file  Intimate Partner Violence: Not on file    History reviewed. No pertinent family history.  The following portions of the patient's history were reviewed and updated as appropriate: allergies, current medications, past family history, past medical history, past social history, past surgical history and problem list.  Review of Systems Review of Systems - Negative except as mentioned in HPI Review of Systems - General ROS: negative for - chills, fatigue, fever, hot flashes, malaise or night sweats Hematological and Lymphatic ROS: negative for - bleeding problems or swollen lymph nodes Gastrointestinal ROS: negative for - abdominal pain, blood in stools, change in bowel habits and nausea/vomiting Musculoskeletal ROS: negative for - joint pain, muscle pain or muscular weakness Genito-Urinary ROS: negative for - change in menstrual cycle, dysmenorrhea, dyspareunia, dysuria, genital discharge, genital ulcers, hematuria, incontinence, irregular/heavy menses, nocturia or pelvic  pain.  Objective:   BP 128/88    Pulse 92    Ht 5\' 3"  (1.6 m)    Wt 181 lb 12.8 oz (82.5 kg)    LMP  (LMP Unknown)    BMI 32.20 kg/m  CONSTITUTIONAL: Well-developed, well-nourished female in no acute distress.  HENT:  Normocephalic, atraumatic.  NECK: Normal range of motion, supple, no masses.  Normal thyroid.  SKIN: Skin is warm and dry. No rash noted. Not diaphoretic. No erythema. No pallor. Center Line: Alert and oriented to person, place, and time. PSYCHIATRIC: Normal mood and affect. Normal behavior. Normal judgment and thought content. CARDIOVASCULAR:Not Examined RESPIRATORY: Not Examined BREASTS: Not  Examined ABDOMEN: Soft, non distended; Non tender.  No Organomegaly. PELVIC:  External Genitalia: Normal  BUS: Normal  Vagina: Normal, white particulate discharge, no odor   Cervix: Normal MUSCULOSKELETAL: Normal range of motion. No tenderness.  No cyanosis, clubbing, or edema.   Assessment:   1. Screening examination for STD (sexually transmitted disease)  Plan:   Pt states her partner cheated and is requesting screening. She denies any symptoms.  Will follow up with results. Pt states she get has PCP that she has pap completed with. Had one last year it was negative. Discussed need for mammogram this year. She is due for her annual with her pcp in May.    Philip Aspen, CNM

## 2021-05-07 NOTE — Patient Instructions (Signed)
Safe Sex Practicing safe sex means taking steps before and during sex to reduce your risk of: Getting an STI (sexually transmitted infection). Giving your partner an STI. Unwanted or unplanned pregnancy. How to practice safe sex Ways you can practice safe sex  Limit your sexual partners to only one partner who is having sex with only you. Avoid using alcohol and drugs before having sex. Alcohol and drugs can affect your judgment. Before having sex with a new partner: Talk to your partner about past partners, past STIs, and drug use. Get screened for STIs and discuss the results with your partner. Ask your partner to get screened too. Check your body regularly for sores, blisters, rashes, or unusual discharge. If you notice any of these problems, visit your health care provider. Avoid sexual contact if you have symptoms of an infection or you are being treated for an STI. While having sex, use a condom. Make sure to: Use a condom every time you have vaginal, oral, or anal sex. Both females and males should wear condoms during oral sex. Keep condoms in place from the beginning to the end of sexual activity. Use a latex condom, if possible. Latex condoms offer the best protection. Use only water-based lubricants with a condom. Using petroleum-based lubricants or oils will weaken the condom and increase the chance that it will break. Ways your health care provider can help you practice safe sex  See your health care provider for regular screenings, exams, and tests for STIs. Talk with your health care provider about what kind of birth control (contraception) is best for you. Get vaccinated against hepatitis B and human papillomavirus (HPV). If you are at risk of being infected with HIV (human immunodeficiency virus), talk with your health care provider about taking a prescription medicine to prevent HIV infection. You are at risk for HIV if you: Are a man who has sex with other men. Are  sexually active with more than one partner. Take drugs by injection. Have a sex partner who has HIV. Have unprotected sex. Have sex with someone who has sex with both men and women. Have had an STI. Follow these instructions at home: Take over-the-counter and prescription medicines only as told by your health care provider. Keep all follow-up visits. This is important. Where to find more information Centers for Disease Control and Prevention: www.cdc.gov Planned Parenthood: www.plannedparenthood.org Office on Women's Health: www.womenshealth.gov Summary Practicing safe sex means taking steps before and during sex to reduce your risk getting an STI, giving your partner an STI, and having an unwanted or unplanned pregnancy. Before having sex with a new partner, talk to your partner about past partners, past STIs, and drug use. Use a condom every time you have vaginal, oral, or anal sex. Both females and males should wear condoms during oral sex. Check your body regularly for sores, blisters, rashes, or unusual discharge. If you notice any of these problems, visit your health care provider. See your health care provider for regular screenings, exams, and tests for STIs. This information is not intended to replace advice given to you by your health care provider. Make sure you discuss any questions you have with your health care provider. Document Revised: 09/11/2019 Document Reviewed: 09/11/2019 Elsevier Patient Education  2022 Elsevier Inc.  

## 2021-05-09 ENCOUNTER — Encounter: Payer: Self-pay | Admitting: Certified Nurse Midwife

## 2021-05-09 LAB — HIV ANTIBODY (ROUTINE TESTING W REFLEX): HIV Screen 4th Generation wRfx: NONREACTIVE

## 2021-05-09 LAB — HEPATITIS B SURFACE ANTIGEN: Hepatitis B Surface Ag: NEGATIVE

## 2021-05-09 LAB — CERVICOVAGINAL ANCILLARY ONLY
Bacterial Vaginitis (gardnerella): NEGATIVE
Candida Glabrata: NEGATIVE
Candida Vaginitis: NEGATIVE
Chlamydia: NEGATIVE
Comment: NEGATIVE
Comment: NEGATIVE
Comment: NEGATIVE
Comment: NEGATIVE
Comment: NEGATIVE
Comment: NORMAL
Neisseria Gonorrhea: NEGATIVE
Trichomonas: NEGATIVE

## 2021-05-09 LAB — HSV(HERPES SIMPLEX VRS) I + II AB-IGG
HSV 1 Glycoprotein G Ab, IgG: <0.91 index (ref 0.00–0.90)
HSV 2 IgG, Type Spec: 14.9 index — ABNORMAL HIGH (ref 0.00–0.90)

## 2021-05-09 LAB — RPR: RPR Ser Ql: NONREACTIVE

## 2021-05-09 LAB — HEPATITIS C ANTIBODY: Hep C Virus Ab: 0.3 s/co ratio (ref 0.0–0.9)

## 2021-09-03 ENCOUNTER — Other Ambulatory Visit: Payer: Self-pay | Admitting: Family Medicine

## 2021-09-03 DIAGNOSIS — Z1231 Encounter for screening mammogram for malignant neoplasm of breast: Secondary | ICD-10-CM

## 2021-10-02 ENCOUNTER — Ambulatory Visit
Admission: RE | Admit: 2021-10-02 | Discharge: 2021-10-02 | Disposition: A | Discharge status: Home / Self Care | Payer: BC Managed Care – PPO | Source: Ambulatory Visit | Attending: Family Medicine | Admitting: Family Medicine

## 2021-10-02 DIAGNOSIS — Z1231 Encounter for screening mammogram for malignant neoplasm of breast: Secondary | ICD-10-CM | POA: Diagnosis not present

## 2021-10-10 ENCOUNTER — Other Ambulatory Visit: Payer: Self-pay | Admitting: Family Medicine

## 2021-10-10 DIAGNOSIS — R928 Other abnormal and inconclusive findings on diagnostic imaging of breast: Secondary | ICD-10-CM

## 2021-10-10 DIAGNOSIS — N63 Unspecified lump in unspecified breast: Secondary | ICD-10-CM

## 2021-10-29 ENCOUNTER — Ambulatory Visit
Admission: RE | Admit: 2021-10-29 | Discharge: 2021-10-29 | Disposition: A | Discharge status: Home / Self Care | Payer: BC Managed Care – PPO | Source: Ambulatory Visit | Attending: Family Medicine | Admitting: Family Medicine

## 2021-10-29 DIAGNOSIS — R928 Other abnormal and inconclusive findings on diagnostic imaging of breast: Secondary | ICD-10-CM

## 2021-10-29 DIAGNOSIS — N63 Unspecified lump in unspecified breast: Secondary | ICD-10-CM | POA: Insufficient documentation

## 2022-03-29 IMAGING — MR MR CERVICAL SPINE W/O CM
5 series · 39 of 48 positions shown · non-contrast
Comparison: None available.

CLINICAL DATA: Initial evaluation for acute left-sided numbness and
weakness.

EXAM:
MRI CERVICAL SPINE WITHOUT CONTRAST
TECHNIQUE: Multiplanar, multisequence MR imaging of the cervical spine was
performed. No intravenous contrast was administered.

[Series 5: T2 · sagittal · 3.0mm · 0.62mm/px · 8 of 15 slices shown (1 of 2)]
[im 1/15]
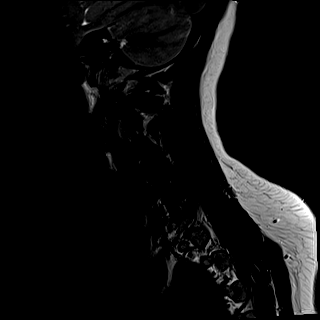
[im 3/15]
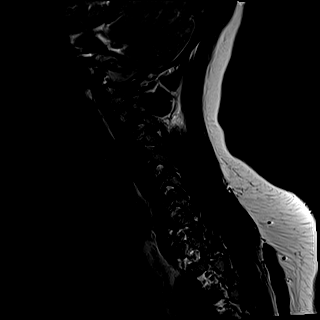
[im 5/15]
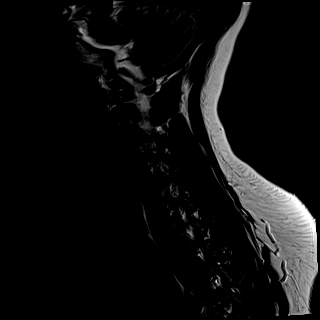
[im 7/15]
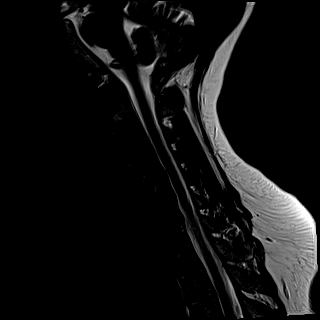
[im 9/15]
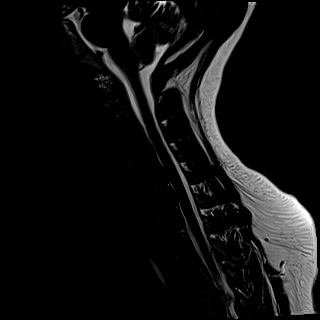
[im 11/15]
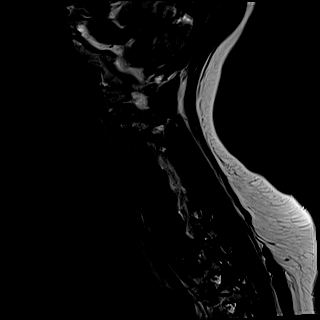
[im 13/15]
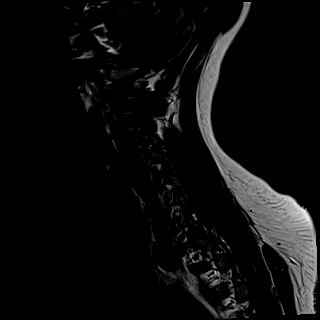
[im 15/15]
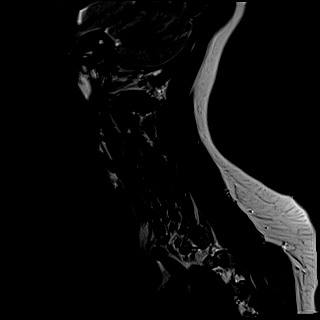

[Series 6: FLAIR · sagittal · 3.0mm · 0.78mm/px · 7 of 15 slices shown]
[im 1/15]
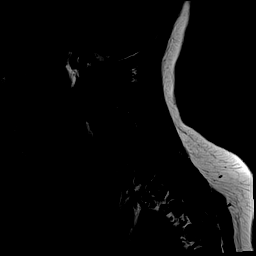
[im 3/15]
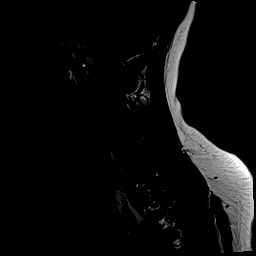
[im 5/15]
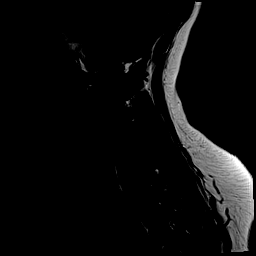
[im 8/15]
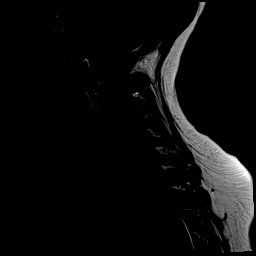
[im 10/15]
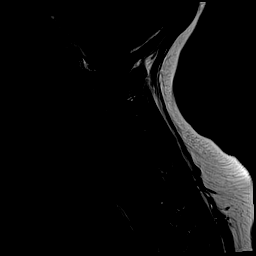
[im 12/15]
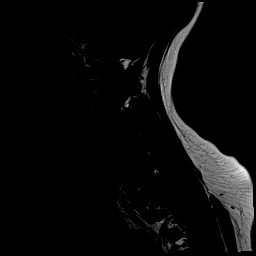
[im 15/15]
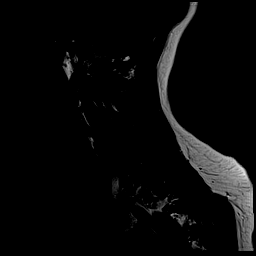

[Series 7: STIR · sagittal · 3.0mm · 0.62mm/px · 7 of 15 slices shown]
[im 1/15]
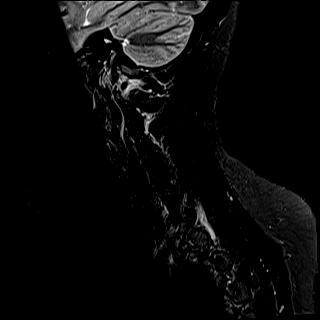
[im 3/15]
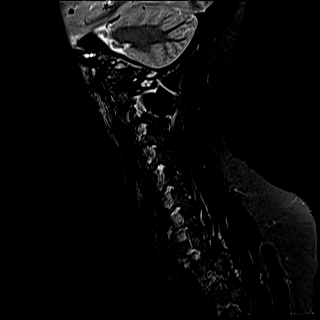
[im 5/15]
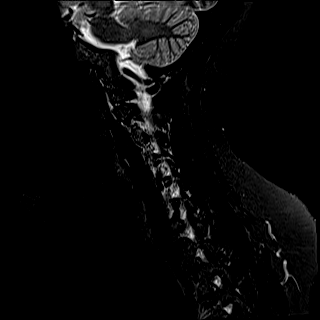
[im 8/15]
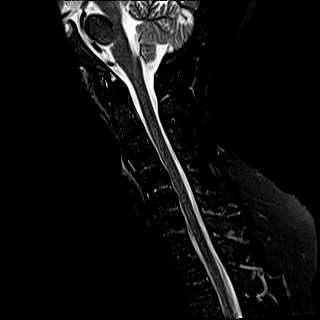
[im 10/15]
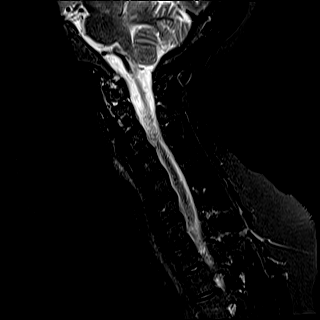
[im 12/15]
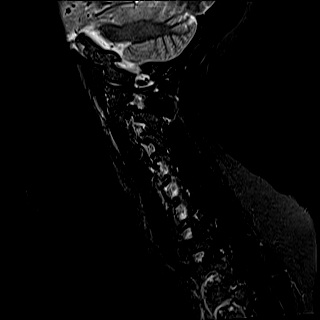
[im 15/15]
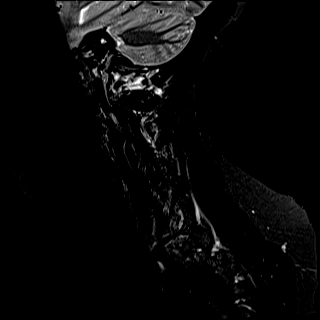

[Series 8: T2 · axial · 3.0mm · 0.70mm/px · z∈[-202,-115]mm · 9 of 28 slices shown (2 of 2)]
[im 1/28]
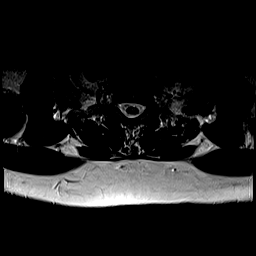
[im 5/28]
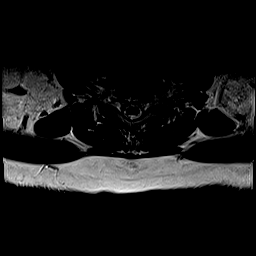
[im 10/28]
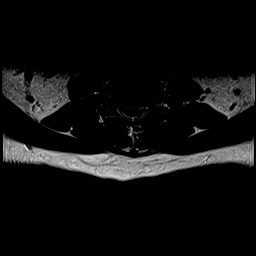
[im 12/28]
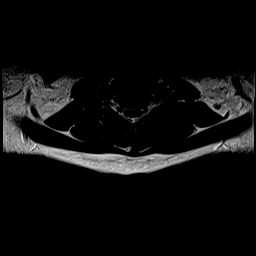
[im 14/28]
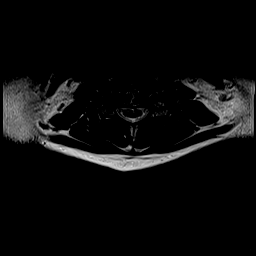
[im 16/28]
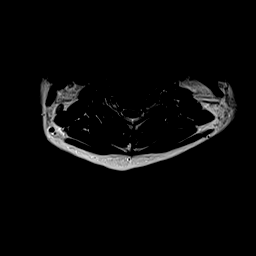
[im 19/28]
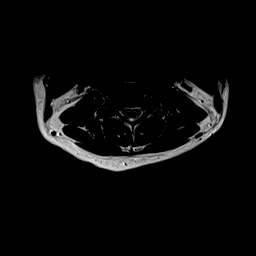
[im 23/28]
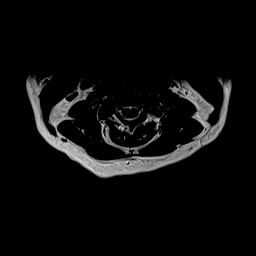
[im 28/28]
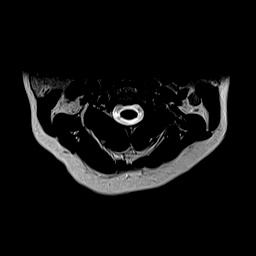

[Series 9: ax mpgr · axial · 3.0mm · 0.35mm/px · z∈[-202,-115]mm · 8 of 28 slices shown]
[im 1/28]
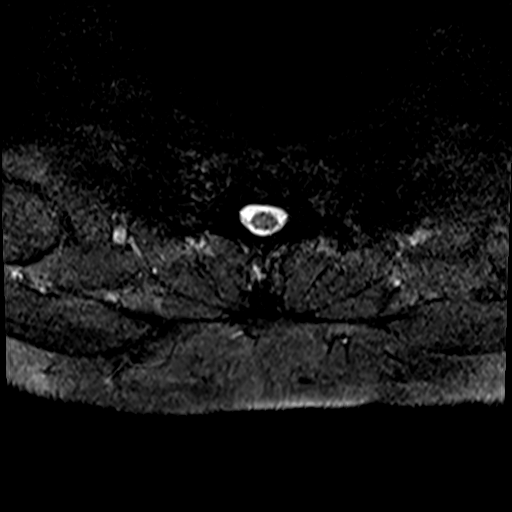
[im 5/28]
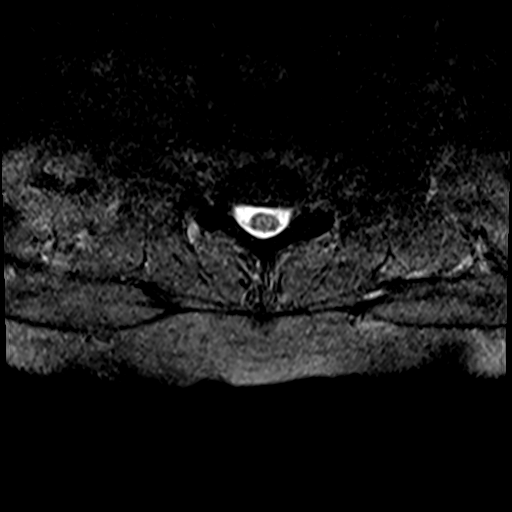
[im 10/28]
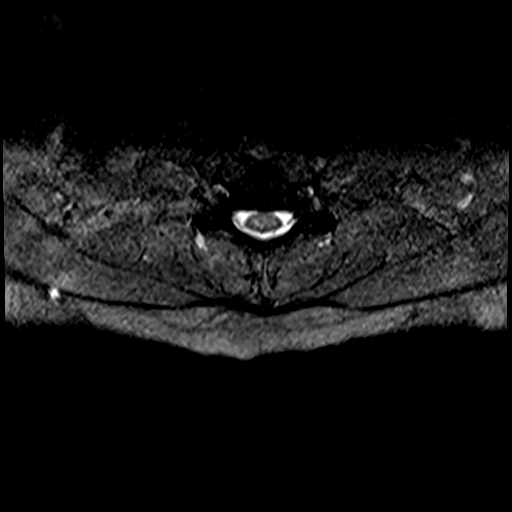
[im 12/28]
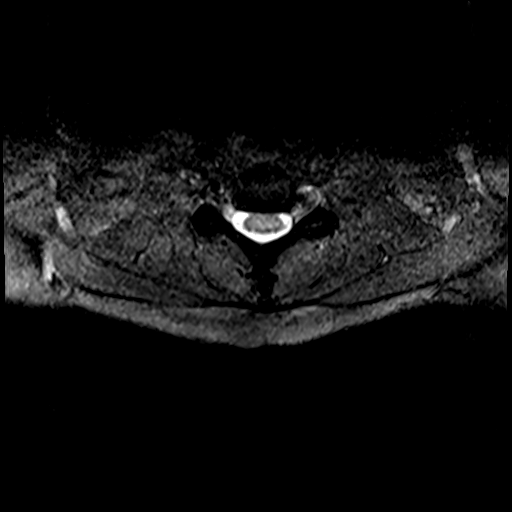
[im 16/28]
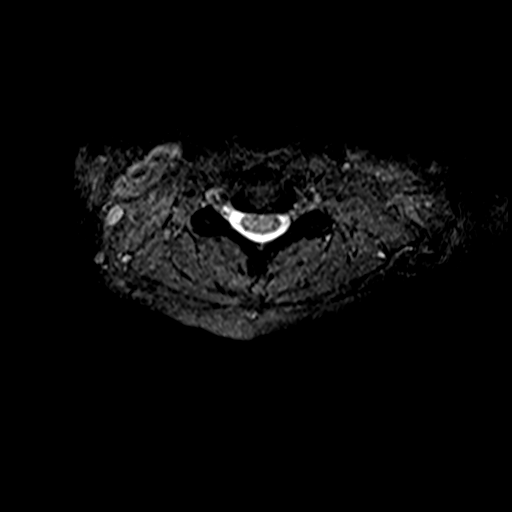
[im 19/28]
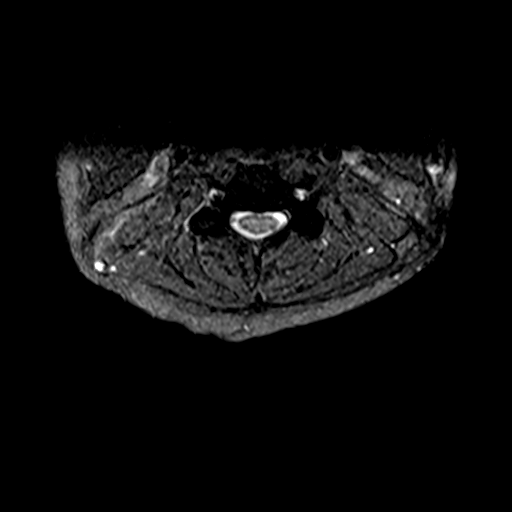
[im 23/28]
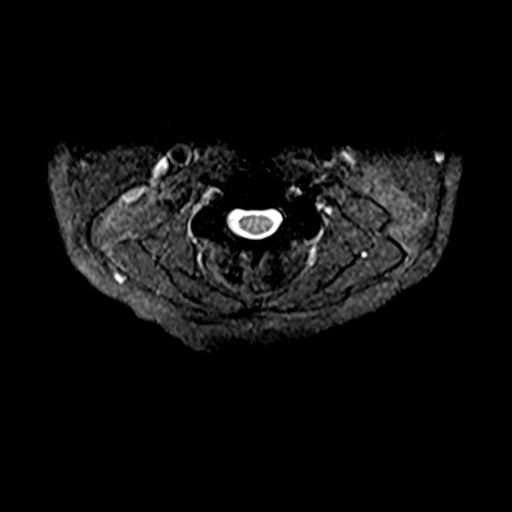
[im 28/28]
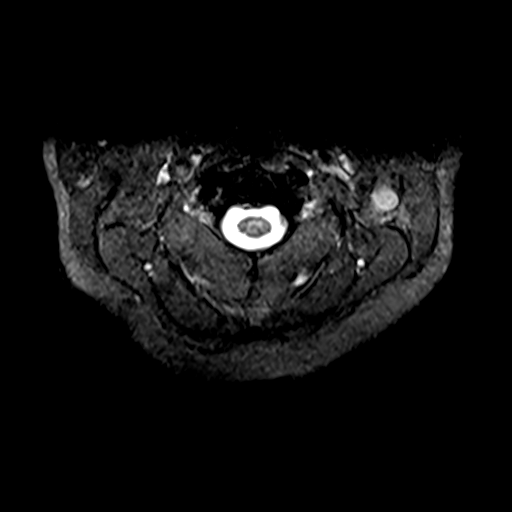

[39 of 48 positions shown; findings below may reference images not displayed]

FINDINGS: Alignment: Straightening with mild reversal of the normal cervical
lordosis. No listhesis.

Vertebrae: Vertebral body height well maintained without acute or
chronic fracture. Bone marrow signal intensity within normal limits.
No discrete or worrisome osseous lesions. No abnormal marrow edema.

Cord: Normal signal and morphology.

Posterior Fossa, vertebral arteries, paraspinal tissues: Visualized
brain and posterior fossa within normal limits. Craniocervical
junction normal. Paraspinous and prevertebral soft tissues within
normal limits. Normal intravascular flow voids seen within the
vertebral arteries bilaterally.

Disc levels:

C2-C3: Unremarkable.

C3-C4:  Unremarkable.

C4-C5: Mild annular disc bulge. No spinal stenosis. Foramina remain
patent.

C5-C6: Mild annular disc bulge. No spinal stenosis. Foramina remain
patent.

C6-C7: Minimal annular disc bulge. No spinal stenosis. Foramina
remain patent.

C7-T1:  Unremarkable.

Visualized upper thoracic spine demonstrates no significant finding.
IMPRESSION: 1. No acute abnormality within the cervical spine.
2. Mild noncompressive disc bulging at C4-5 through C6-7 without
stenosis or neural impingement.

## 2022-03-30 IMAGING — US US CAROTID DUPLEX BILAT
1 series · 13 of 24 positions shown · non-contrast
Comparison: None.

CLINICAL DATA: Stroke.

EXAM:
BILATERAL CAROTID DUPLEX ULTRASOUND
TECHNIQUE: Gray scale imaging, color Doppler and duplex ultrasound were
performed of bilateral carotid and vertebral arteries in the neck.

[Series 1: us carotid bilateral · 13 of 61 slices shown]
[im 1/61]
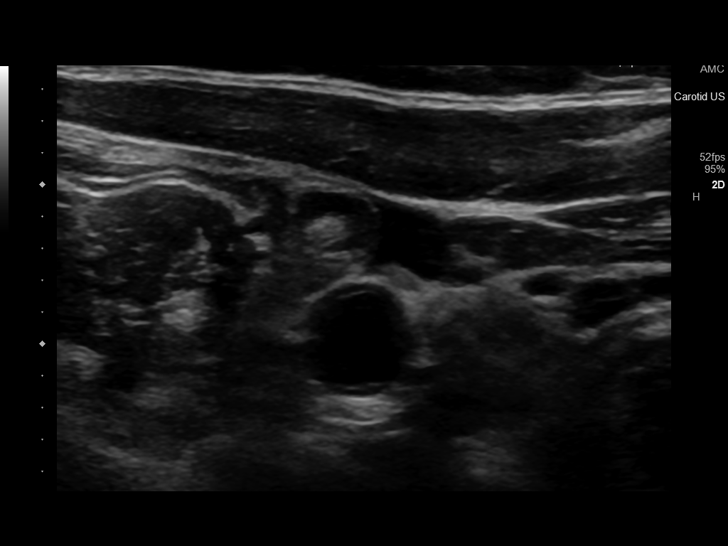
[im 6/61]
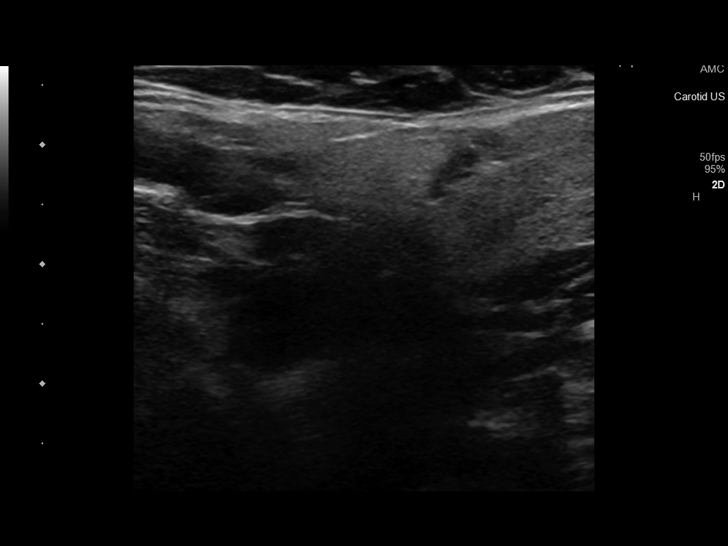
[im 11/61]
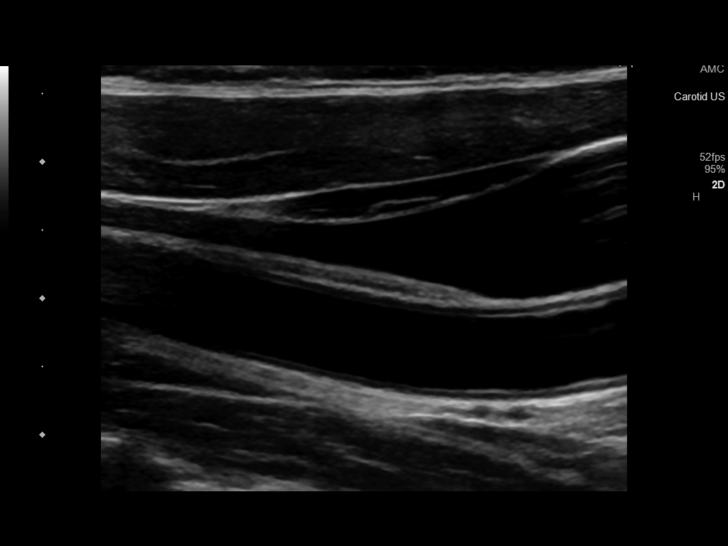
[im 16/61]
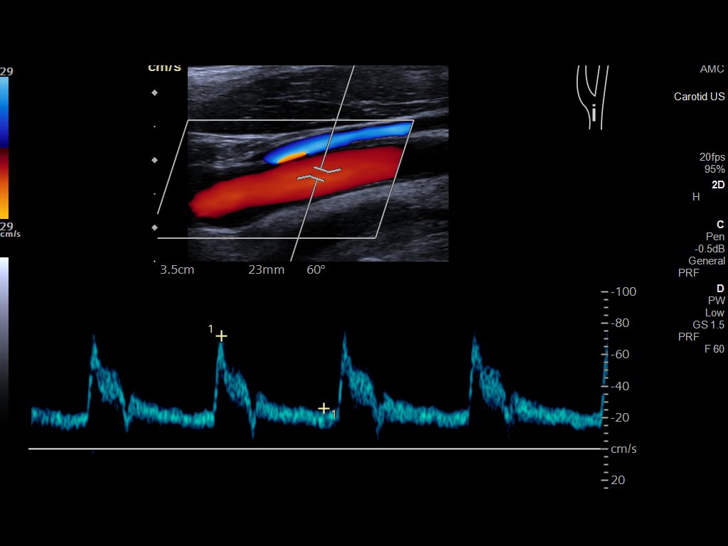
[im 21/61]
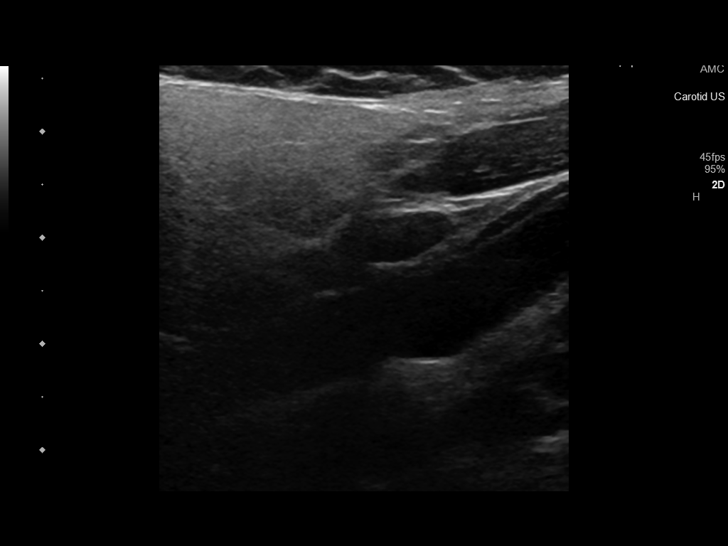
[im 27/61]
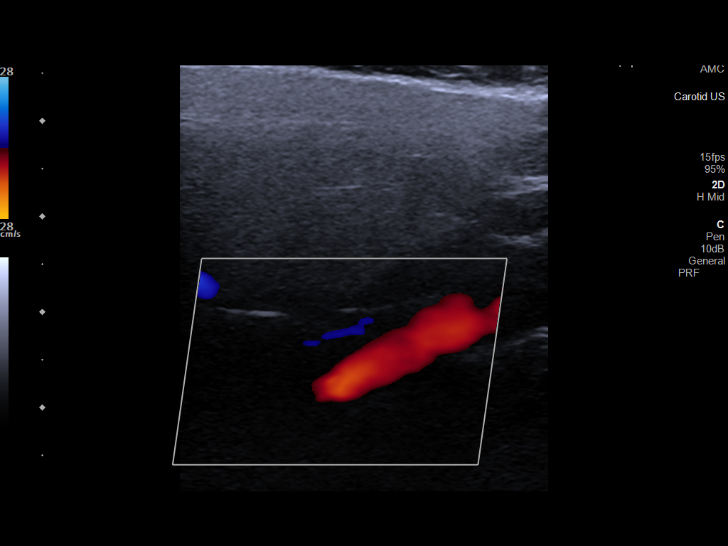
[im 32/61]
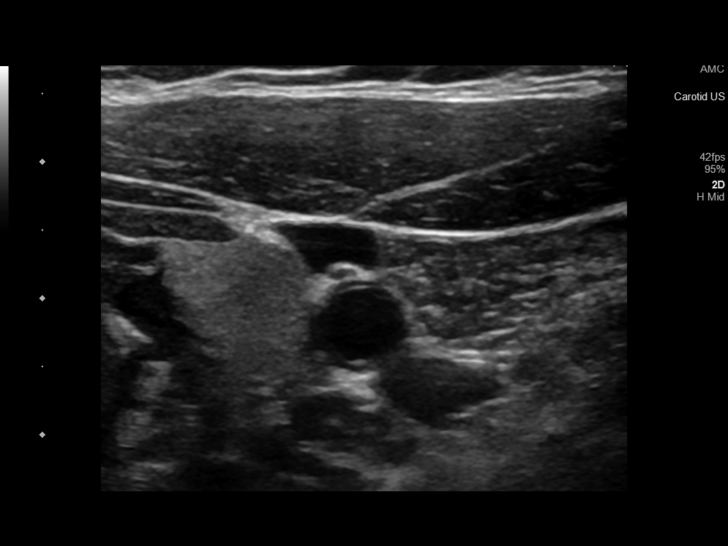
[im 34/61]
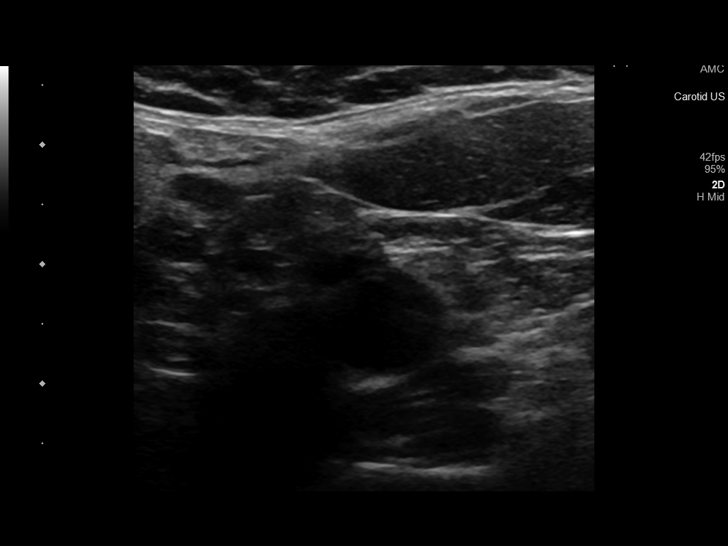
[im 40/61]
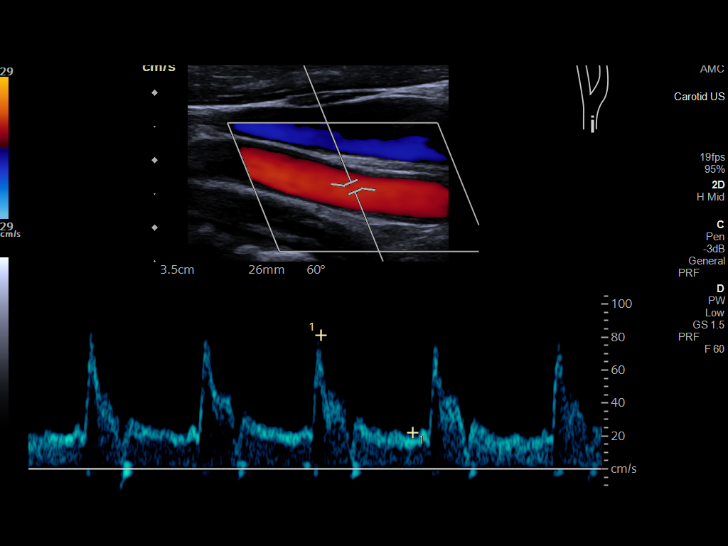
[im 45/61]
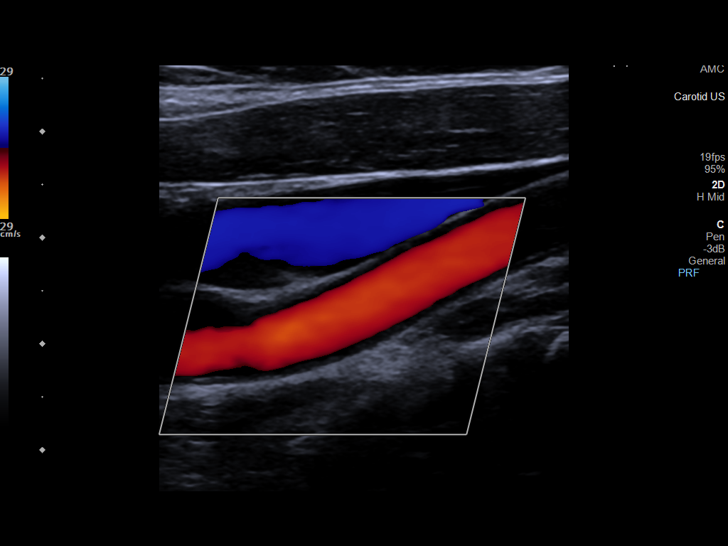
[im 50/61]
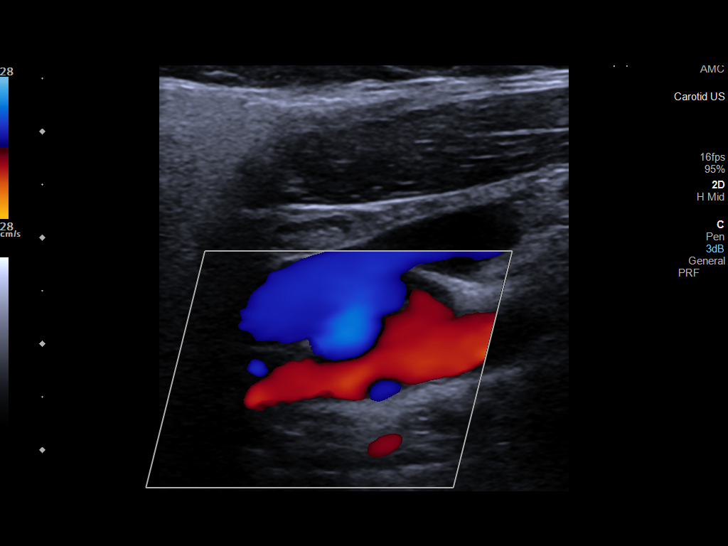
[im 55/61]
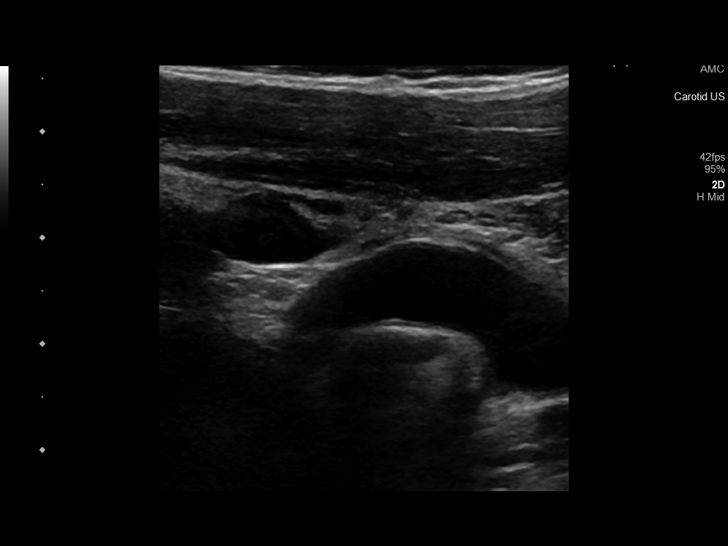
[im 61/61]
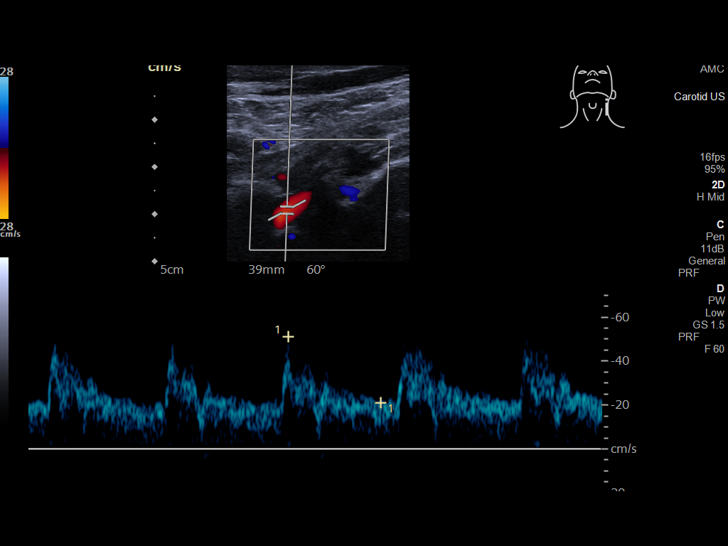

[13 of 24 positions shown; findings below may reference images not displayed]

FINDINGS: Criteria: Quantification of carotid stenosis is based on velocity
parameters that correlate the residual internal carotid diameter
with NASCET-based stenosis levels, using the diameter of the distal
internal carotid lumen as the denominator for stenosis measurement.

The following velocity measurements were obtained:

RIGHT

ICA: 72/30 cm/sec

CCA: 72/26 cm/sec

SYSTOLIC ICA/CCA RATIO:

ECA: 67 cm/sec

LEFT

ICA: 124/58 cm/sec

CCA: 68/24 cm/sec

SYSTOLIC ICA/CCA RATIO:

ECA: 35 cm/sec

RIGHT CAROTID ARTERY: Normal caliber without significant
atherosclerotic change. Homogeneous normal flow demonstrated on
color flow Doppler images with normal spectral flow velocity
waveform pattern.

RIGHT VERTEBRAL ARTERY:  Normal antegrade flow direction.

LEFT CAROTID ARTERY: Normal caliber without significant
atherosclerotic change. Homogeneous normal flow demonstrated on
color flow Doppler images with normal spectral flow velocity
waveform pattern.

LEFT VERTEBRAL ARTERY:  Normal antegrade flow direction.
IMPRESSION: No evidence of hemodynamically significant stenosis of either right
or left internal carotid artery.

## 2023-01-29 ENCOUNTER — Other Ambulatory Visit: Payer: Self-pay | Admitting: Family Medicine

## 2023-01-29 DIAGNOSIS — Z1231 Encounter for screening mammogram for malignant neoplasm of breast: Secondary | ICD-10-CM

## 2023-02-10 ENCOUNTER — Ambulatory Visit
Admission: RE | Admit: 2023-02-10 | Discharge: 2023-02-10 | Disposition: A | Discharge status: Home / Self Care | Payer: BC Managed Care – PPO | Source: Ambulatory Visit | Attending: Family Medicine | Admitting: Family Medicine

## 2023-02-10 DIAGNOSIS — Z1231 Encounter for screening mammogram for malignant neoplasm of breast: Secondary | ICD-10-CM | POA: Insufficient documentation

## 2023-03-01 ENCOUNTER — Ambulatory Visit: Payer: BC Managed Care – PPO | Admitting: Obstetrics & Gynecology

## 2023-03-16 ENCOUNTER — Ambulatory Visit: Payer: BC Managed Care – PPO | Admitting: Obstetrics & Gynecology

## 2023-03-16 ENCOUNTER — Other Ambulatory Visit (HOSPITAL_COMMUNITY)
Admission: RE | Admit: 2023-03-16 | Discharge: 2023-03-16 | Disposition: A | Discharge status: Home / Self Care | Payer: BC Managed Care – PPO | Source: Ambulatory Visit | Attending: Obstetrics & Gynecology | Admitting: Obstetrics & Gynecology

## 2023-03-16 ENCOUNTER — Encounter: Payer: Self-pay | Admitting: Obstetrics & Gynecology

## 2023-03-16 VITALS — BP 158/77 | HR 98 | Ht 63.0 in | Wt 175.0 lb

## 2023-03-16 DIAGNOSIS — B977 Papillomavirus as the cause of diseases classified elsewhere: Secondary | ICD-10-CM | POA: Insufficient documentation

## 2023-03-16 DIAGNOSIS — Z3202 Encounter for pregnancy test, result negative: Secondary | ICD-10-CM | POA: Diagnosis not present

## 2023-03-16 DIAGNOSIS — N870 Mild cervical dysplasia: Secondary | ICD-10-CM | POA: Diagnosis not present

## 2023-03-16 LAB — POCT URINE PREGNANCY: Preg Test, Ur: NEGATIVE

## 2023-03-16 NOTE — Progress Notes (Signed)
    GYNECOLOGY PROGRESS NOTE  Subjective:    Patient ID: Alicia Perez, female    DOB: 1981-01-31, 42 y.o.   MRN: [id number]  HPI  Patient is a 42 y.o. G1P1001 here as a new patient, referred for a pap smear 11/2022 that had normal cytology but + HR HPV. The previous pap was normal cytology and negative HR HPV. 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  The following portions of the patient's history were reviewed and updated as appropriate: allergies, current medications, past family history, past medical history, past social history, past surgical history, and problem list.  Review of Systems Pertinent items are noted in HPI.  She reports that she has been abstinent for the last year.  Objective:   Blood pressure (!) 156/87, pulse 86, height 5\' 3"  (1.6 m), weight 175 lb (79.4 kg). Body mass index is 31 kg/m. Well nourished, well hydrated Black female, no apparent distress She is ambulating and conversing normally. UPT negative, consent signed, time out done Speculum placed. Cervix prepped with acetic acid. Transformation zone not seen. Colpo inadequate. Her cervix is so stenotic that I could only see a minute opening. The cervix has changes associated with a previous LEEP. I obtained a random biopsy of the ectocervix at the 6 o'clock position. Silver nitrate achieved hemostasis. She tolerated the procedure well.   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 I will have her make an appt for LEEP in the near future.

## 2023-03-19 LAB — SURGICAL PATHOLOGY

## 2023-03-29 ENCOUNTER — Other Ambulatory Visit (HOSPITAL_COMMUNITY)
Admission: RE | Admit: 2023-03-29 | Discharge: 2023-03-29 | Disposition: A | Discharge status: Home / Self Care | Payer: BC Managed Care – PPO | Source: Ambulatory Visit | Attending: Obstetrics and Gynecology | Admitting: Obstetrics and Gynecology

## 2023-03-29 ENCOUNTER — Ambulatory Visit: Payer: BC Managed Care – PPO | Admitting: Obstetrics and Gynecology

## 2023-03-29 ENCOUNTER — Telehealth: Payer: Self-pay | Admitting: Obstetrics and Gynecology

## 2023-03-29 VITALS — BP 153/109 | HR 114 | Resp 16 | Ht 63.0 in | Wt 181.4 lb

## 2023-03-29 DIAGNOSIS — N870 Mild cervical dysplasia: Secondary | ICD-10-CM

## 2023-03-29 DIAGNOSIS — N871 Moderate cervical dysplasia: Secondary | ICD-10-CM | POA: Diagnosis not present

## 2023-03-29 HISTORY — PX: CERVICAL BIOPSY  W/ LOOP ELECTRODE EXCISION: SUR135

## 2023-03-29 NOTE — Telephone Encounter (Signed)
I won't be here Jan 2nd.  I'm off from 12/31 - 1/6.  Dec 27 is fine

## 2023-03-29 NOTE — Telephone Encounter (Signed)
This pt needs to come back in 2-3 weeks for post op check.  She is off of work for two weeks.  She goes back to work on Jan. 7th. You don't have any GYN spots available duirng those weeks.  Can I schedule her on Dec. 27 or Jan. 2nd (in a spot that works best for you)?

## 2023-03-29 NOTE — Progress Notes (Signed)
    GYNECOLOGY OFFICE PROCEDURE NOTE  Alicia Perez is a 42 y.o. G1P1001 here for LEEP, referred by Dr. Nicholaus Bloom. No GYN concerns. Pap smear and colposcopy history reviewed.    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  Unable to visualize transformation zone due to severe stenosis previous h/o LEEP in 2014, so inadequate colposcopy documented.  - ECC could not be performed due to severe stenosis.  Risks, benefits, alternatives, and limitations of procedure explained to patient, including pain, bleeding, infection, failure to remove abnormal tissue and failure to cure dysplasia, need for repeat procedures, damage to pelvic organs, cervical incompetence.  Role of HPV,cervical dysplasia and need for close followup was empasized. Informed written consent was obtained. All questions were answered. Time out performed. Urine pregnancy test was negative.  ??Procedure: The patient was placed in lithotomy position and the bivalved coated speculum was placed in the patient's vagina. A grounding pad placed on the patient. The cervix was cleansed with a betadine solution.  Local anesthesia was administered via an intracervical block using 10 ml of 1% Lidocaine with epinephrine. The suction was turned on and the Extra Small Fisher Cone Biopsy Excisor on 60 Watts of blended current was used to excise the area at the internal cervical canal. An ECC was then performed and specimen collected and sent to pathology. Excellent hemostasis was achieved using roller ball coagulation set at 60 Watts coagulation current. Monsel's solution was then applied and the speculum was removed from the vagina. Specimens were sent to pathology.  ?The patient tolerated the procedure well. Post-operative instructions given to patient, including instruction to seek medical attention for persistent bright red bleeding, fever, abdominal/pelvic pain, dysuria, nausea or vomiting. She was also told about the  possibility of having copious yellow to black tinged discharge for weeks. She was counseled to avoid anything in the vagina (sex/douching/tampons) for 3 weeks. She has a 2-3 weeks post-operative check to assess wound healing, review results and discuss further management.     Hildred Laser, MD [facility]

## 2023-03-29 NOTE — Patient Instructions (Signed)
Loop Electrosurgical Excision Procedure Loop electrosurgical excision procedure (LEEP) is the cutting and removal (excision) of tissue from the cervix. The cervix is the bottom part of the uterus that opens into the vagina. The tissue that is removed from the cervix is examined to see if there are cancer cells or cells that might turn into cancer (precancerous cells). LEEP may be done when: You have abnormal bleeding from your cervix. You have an abnormal Pap test result. Your health care provider finds abnormalities on your cervix during an exam. LEEP typically only takes a few minutes and is often done in the health care provider's office. The procedure is safe for women who are trying to get pregnant. The procedure is usually not done during a menstrual period or during pregnancy. Tell a health care provider about: Any allergies you have. All medicines you are taking, including vitamins, herbs, eye drops, creams, and over-the-counter medicines. Any problems you or family members have had with anesthetic medicines. Any bleeding problems you have. Any medical conditions you have or have had. This includes current or past vaginal infections, such as herpes or STIs (sexually transmitted infections). Whether you are pregnant or may be pregnant. If you are having vaginal bleeding on the day of the procedure. What are the risks? Generally, this is a safe procedure. However, problems may occur, including: Infection. Bleeding. Allergic reactions to medicines. Changes or scarring in the cervix. Damage to nearby structures or organs. Increased risk of early (preterm) labor in future pregnancies. What happens before the procedure? Ask your health care provider about: Changing or stopping your regular medicines. This is especially important if you are taking diabetes medicines or blood thinners. Taking medicines such as aspirin and ibuprofen. These medicines can thin your blood. Do not take these  medicines unless your health care provider tells you to take them. Taking over-the-counter medicines, vitamins, herbs, and supplements. Your health care provider may recommend that you take pain medicine before the procedure. Ask your health care provider if you should plan to have a responsible adult take you home after the procedure. What happens during the procedure?  An instrument called a speculum will be placed in your vagina. This will allow your health care provider to see your cervix. You will be given a medicine to numb the area (local anesthetic). The medicine will be injected into your cervix and the surrounding area. A solution will be applied to your cervix. This solution will help the health care provider find the abnormal cells that need to be removed. A thin wire loop will be passed through your vagina to your cervix. The wire will remove layers of abnormal cervical cells. The wire will burn (cauterize) the cervical tissue with an electrical current during cell removal. Open blood vessels will be cauterized to prevent bleeding. You might feel some pressure, aching, and cramping. If you feel like you will faint during the procedure, tell your health care provider right away. A paste may be applied to the cauterized area of your cervix to help control bleeding. The sample of cervical tissue will be sent to a lab and looked at under a microscope. The procedure may vary among health care providers and hospitals. What can I expect after the procedure? After the procedure, it is common to have: Mild abdominal cramps that may last for up to 1 week. A small amount of pink-tinged or bloody vaginal discharge, including light to moderate bleeding, for 1-2 weeks. A brown- or black-colored discharge coming from your  vagina, if a paste was used on the cervix to control bleeding. It is up to you to get the results of your procedure. Ask your health care provider, or the department that is  doing the procedure, when your results will be ready. Follow these instructions at home: Take over-the-counter and prescription medicines only as told by your health care provider. Return to your normal activities as told by your health care provider. Ask your health care provider what activities are safe for you. Do not put anything in your vagina for 2 weeks after the procedure or until your health care provider says that it is okay. This includes tampons, creams, and douches. Do not have sex until your health care provider approves. Keep all follow-up visits. This is important. Contact a health care provider if: You have a fever or chills. You feel very weak. You have blood clots or bleeding that is heavier than a normal menstrual period. Bleeding that soaks a pad in less than 1 hour is considered heavy bleeding. You develop a bad-smelling discharge from your vagina. You have severe abdominal pain or cramping. Summary Loop electrosurgical excision procedure (LEEP) is the removal of tissue from the cervix. The removed tissue will be checked for precancerous cells or cancer cells. LEEP typically only takes a few minutes and is often done in your health care provider's office. Do not put anything in your vagina for 2 weeks after the procedure or until your health care provider says that it is okay. This includes tampons, creams, and douches. Ask your health care provider, or the department that is doing the procedure, when your results will be ready. This information is not intended to replace advice given to you by your health care provider. Make sure you discuss any questions you have with your health care provider. Document Revised: 09/11/2020 Document Reviewed: 09/11/2020 Elsevier Patient Education  2024 Elsevier Inc. LEEP POST-PROCEDURE INSTRUCTIONS  You may take Ibuprofen, Aleve or Tylenol for pain if needed.  Cramping is normal.  You will have black and/or bloody discharge at first.   This will lighten and then turn clear before completely resolving.  This will take 2 to 3 weeks.  Put nothing in your vagina until the bleeding or discharge stops (usually 2 or3 days).  You need to call if you have redness around the biopsy site, if there is any unusual draining, if the bleeding is heavy, or if you are concerned.  Shower or bathe as normal  We will call you within one week with results or we will discuss the results at your follow-up appointment if needed.  You will need to return for a follow-up Pap smear as directed by your physician.

## 2023-03-30 ENCOUNTER — Telehealth: Payer: Self-pay

## 2023-03-30 NOTE — Telephone Encounter (Signed)
Pt calling; had procedure done yesterday; was told after procedure care would be sent to her mychart; she hasn't seen it.  Adv I will try to get it sent to her - to keep watching for it.

## 2023-03-30 NOTE — Telephone Encounter (Signed)
She is scheduled for Dec. 27th.  Thank you!

## 2023-03-31 LAB — SURGICAL PATHOLOGY

## 2023-04-15 NOTE — Progress Notes (Signed)
    OBSTETRICS/GYNECOLOGY POST-OPERATIVE CLINIC VISIT  Subjective:     Alicia Perez is a 42 y.o. 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 The patient is not having any pain.  Reports that her bleeding was little heavier than expected, more like a menstrual cycle. However this has now resolved.   The following portions of the patient's history were reviewed and updated as appropriate: allergies, current medications, past family history, past medical history, past social history, past surgical history, and problem list.  Review of Systems Pertinent items are noted in HPI.   Objective:   BP (!) 145/102   Pulse 91   Ht 5\' 3"  (1.6 m)   Wt 181 lb 9.6 oz (82.4 kg)   BMI 32.17 kg/m  Body mass index is 32.17 kg/m.  General:  alert and no distress  Abdomen: soft, bowel sounds active, non-tender  Incision:   healing well, no drainage, no erythema, no hernia, no seroma, no swelling, no dehiscence, incision well approximated    Pathology:  A. CERVIX, LEEP:  - High-grade squamous intraepithelial lesion (CIN2, high grade  dysplasia)   B. ENDOCERVIX, CURETTAGE:  - Benign cervical glandular mucosa with metaplastic changes  - Negative for dysplasia or malignancy   Assessment:   Patient s/p LEEP (of Endocervix) CIN II Doing well postoperatively.   Plan:   Pathology report discussed.  CIN II noted on biopsy report. Advised on f/u in 1 year with repeat pap smear.  Activity restrictions: none Follow up: 1  year for repeat pap smear.      Hildred Laser, MD [facility]

## 2023-04-16 ENCOUNTER — Ambulatory Visit (INDEPENDENT_AMBULATORY_CARE_PROVIDER_SITE_OTHER): Payer: BC Managed Care – PPO | Admitting: Obstetrics and Gynecology

## 2023-04-16 ENCOUNTER — Encounter: Payer: Self-pay | Admitting: Obstetrics and Gynecology

## 2023-04-16 VITALS — BP 145/102 | HR 91 | Ht 63.0 in | Wt 181.6 lb

## 2023-04-16 DIAGNOSIS — Z4889 Encounter for other specified surgical aftercare: Secondary | ICD-10-CM

## 2023-04-16 DIAGNOSIS — N871 Moderate cervical dysplasia: Secondary | ICD-10-CM

## 2023-04-16 DIAGNOSIS — Z9889 Other specified postprocedural states: Secondary | ICD-10-CM | POA: Insufficient documentation

## 2023-04-16 HISTORY — DX: Moderate cervical dysplasia: N87.1

## 2024-02-01 ENCOUNTER — Other Ambulatory Visit: Payer: Self-pay | Admitting: Family Medicine

## 2024-02-01 DIAGNOSIS — Z1231 Encounter for screening mammogram for malignant neoplasm of breast: Secondary | ICD-10-CM

## 2024-02-11 NOTE — Progress Notes (Signed)
 Referring Provider:  PCP Channing Schaffer, FNP  HPI:  Alicia Perez is a 43 y.o.  G1P1001  who presents today for evaluation and management of abnormal cervical cytology.    Prior pap smears:  12/23/2023: NILM HPV + (negative 16/18/45) 11/19/2021: NILM HPV + (undiff) 10/23/2011: ASCUS HPV + 2008 - abnormal pap with colpo in [place] , unsure of result  Prior cervical / vaginal findings: Colposcopy at [facility] 2014 and in [place]  2008 03/16/23: Colpo [facility]: 6:00 CIN1 2014:  Colpo [facility]: CIN 2-3 on bx (12&3), no ECC performed 2008: unsure of results, no records available  Prior cervical treatment(s): LEEP 03/29/2023: CIN2, ECC benign 08/10/2012:  CIN1 with endocervical involvement @ 12:00 and 3:00 (biopsy site reaction).  CIN 1 with endocervical gland involvement (A1, A3) on ECC.   Symptoms/History:  -Abnormal vaginal discharge: brown discharge -Postmenopausal: no -Intermenstrual bleeding: none -Postcoital bleeding: none  -Bleeding problems (non-gyn): none -Contraception: Depo -Number of current sexual partners: 0 -Number of partners in lifetime: 6 -History of a high risk partner: none -History of STDs: trich -Smoking: none -Gardasil Vaccine: no      ROS:  Pertinent items are noted in HPI.  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    ALLERGIES:  Codeine and Penicillins  She has a current medication list which includes the following prescription(s): aspirin  ec, atorvastatin , vitamin d-1000 max st, gabapentin, hydrochlorothiazide, medroxyprogesterone , metoprolol succinate, omeprazole, zolpidem, and orlistat.  Physical Exam: -Vitals:  BP 137/84   Pulse 76   Wt 188 lb (85.3 kg)   LMP 11/21/2023 (Exact Date)   BMI 33.30 kg/m   Physical Exam Vitals and nursing note reviewed. Exam conducted with a chaperone present.  Constitutional:      Appearance: Normal appearance.  HENT:     Head: Normocephalic and atraumatic.  Eyes:     Extraocular Movements: Extraocular movements intact.  Pulmonary:     Effort: Pulmonary effort is normal.  Genitourinary:    General: Normal vulva.     Vagina: Normal.     Comments: Cervical os severely stenotic, pinpoint; tissue diminished due to LEEP x 2; attempted to dilate and unable due to pain.  Neurological:     General: No focal deficit present.     Mental Status: She is alert.  Psychiatric:        Mood and Affect: Mood normal.     ASSESSMENT:  Alicia Perez is a 43 y.o. G1P1001 with NILM and HPV-HR positive, undifferentiated on two most recent paps (12/23/23) here for colposcopy today, did not perform due to cervical  stenosis, unable to dilate today. This has been noted last year during her colpo (03/16/23) and LEEP (03/29/23). Hx of CIN2-3 and LEEP x 2.  -Will have patient reschedule and pre-dose with Cytotec x 2 prior to repeat attempt -Gardasil counseling done, #1 given today  -Return when able for repeat attempt at colpo -- pt aware if repeat attempt is inadequate, will need CKC.    Estil Mangle, DO [facility]

## 2024-02-15 ENCOUNTER — Ambulatory Visit: Admitting: Obstetrics

## 2024-02-15 ENCOUNTER — Encounter: Payer: Self-pay | Admitting: Obstetrics

## 2024-02-15 VITALS — BP 137/84 | HR 76 | Wt 188.0 lb

## 2024-02-15 DIAGNOSIS — Z01812 Encounter for preprocedural laboratory examination: Secondary | ICD-10-CM

## 2024-02-15 DIAGNOSIS — Z23 Encounter for immunization: Secondary | ICD-10-CM

## 2024-02-15 DIAGNOSIS — N882 Stricture and stenosis of cervix uteri: Secondary | ICD-10-CM | POA: Insufficient documentation

## 2024-02-15 DIAGNOSIS — Z3202 Encounter for pregnancy test, result negative: Secondary | ICD-10-CM

## 2024-02-15 DIAGNOSIS — R87810 Cervical high risk human papillomavirus (HPV) DNA test positive: Secondary | ICD-10-CM | POA: Diagnosis not present

## 2024-02-15 LAB — POCT URINE PREGNANCY: Preg Test, Ur: NEGATIVE

## 2024-02-15 MED ORDER — MISOPROSTOL 200 MCG PO TABS: 400.0000 ug | ORAL_TABLET | ORAL | 0 refills | Status: AC

## 2024-03-06 ENCOUNTER — Ambulatory Visit
Admission: RE | Admit: 2024-03-06 | Discharge: 2024-03-06 | Disposition: A | Discharge status: Home / Self Care | Source: Ambulatory Visit | Attending: Family Medicine | Admitting: Family Medicine

## 2024-03-06 DIAGNOSIS — Z1231 Encounter for screening mammogram for malignant neoplasm of breast: Secondary | ICD-10-CM | POA: Insufficient documentation

## 2024-04-04 ENCOUNTER — Encounter: Admitting: Obstetrics

## 2024-04-17 ENCOUNTER — Ambulatory Visit

## 2024-04-17 VITALS — BP 127/83 | HR 89 | Ht 63.0 in | Wt 191.6 lb

## 2024-04-17 DIAGNOSIS — Z23 Encounter for immunization: Secondary | ICD-10-CM | POA: Diagnosis not present

## 2024-04-17 NOTE — Progress Notes (Signed)
" ° ° °  NURSE VISIT NOTE  Subjective:    Patient ID: Alicia Perez, female    DOB: Nov 03, 1980, 43 y.o.   MRN: [id number]  HPI  Patient is a 43 y.o. G40P1001 female Single African American female who presents for her second Gardasil injection. Order to administer given by Estil Mangle, MD on 02/15/2024.   Objective:    BP 127/83   Pulse 89   Ht 5' 3 (1.6 m)   Wt 191 lb 9.6 oz (86.9 kg)   BMI 33.94 kg/m   43 y.o. LMP:  Depo  Contraception:  Hormonal Contraception: Injection, Rings and Patches Given by: Camelia Fetters, CMA Site:  left deltoid  Lab Review  No results found for any visits on 04/17/24.    Assessment:   1. Need for HPV vaccine      Plan:   Patient will return in 4 months for third injection.    Camelia Fetters, CMA [facility]

## 2024-04-17 NOTE — Patient Instructions (Signed)
 HPV (Human Papillomavirus) Vaccine: What You Need to Know Many vaccine information statements are available in Spanish and other languages. See PromoAge.com.br. 1. Why get vaccinated? HPV (human papillomavirus) vaccine can prevent infection with some types of human papillomavirus. HPV infections can cause certain types of cancers, including: cervical, vaginal, and vulvar cancers in women penile cancer in men anal cancers in both men and women cancers of tonsils, base of tongue, and back of throat (oropharyngeal cancer) in both men and women HPV infections can also cause anogenital warts. HPV vaccine can prevent over 90% of cancers caused by HPV. HPV is spread through intimate skin-to-skin or sexual contact. HPV infections are so common that nearly all people will get at least one type of HPV at some time in their lives. Most HPV infections go away on their own within 2 years. But sometimes HPV infections will last longer and can cause cancers later in life. 2. HPV vaccine HPV vaccine is routinely recommended for adolescents at 79 or 43 years of age to ensure they are protected before they are exposed to the virus. HPV vaccine may be given beginning at age 25 years and vaccination is recommended for everyone through 43 years of age. HPV vaccine may be given to adults 27 through 43 years of age, based on discussions between the patient and health care provider. Most children who get the first dose before 66 years of age need 2 doses of HPV vaccine. People who get the first dose at or after 62 years of age and younger people with certain immunocompromising conditions need 3 doses. Your health care provider can give you more information. HPV vaccine may be given at the same time as other vaccines. 3. Talk with your health care provider Tell your vaccination provider if the person getting the vaccine: Has had an allergic reaction after a previous dose of HPV vaccine, or has any severe,  life-threatening allergies Is pregnant--HPV vaccine is not recommended until after pregnancy In some cases, your health care provider may decide to postpone HPV vaccination until a future visit. People with minor illnesses, such as a cold, may be vaccinated. People who are moderately or severely ill should usually wait until they recover before getting HPV vaccine. Your health care provider can give you more information. 4. Risks of a vaccine reaction Soreness, redness, or swelling where the shot is given can happen after HPV vaccination. Fever or headache can happen after HPV vaccination. People sometimes faint after medical procedures, including vaccination. Tell your provider if you feel dizzy or have vision changes or ringing in the ears. As with any medicine, there is a very remote chance of a vaccine causing a severe allergic reaction, other serious injury, or death. 5. What if there is a serious problem? An allergic reaction could occur after the vaccinated person leaves the clinic. If you see signs of a severe allergic reaction (hives, swelling of the face and throat, difficulty breathing, a fast heartbeat, dizziness, or weakness), call 9-1-1 and get the person to the nearest hospital. For other signs that concern you, call your health care provider. Adverse reactions should be reported to the Vaccine Adverse Event Reporting System (VAERS). Your health care provider will usually file this report, or you can do it yourself. Visit the VAERS website at www.vaers.LAgents.no or call 256-510-8455. VAERS is only for reporting reactions, and VAERS staff members do not give medical advice. 6. The National Vaccine Injury Compensation Program The [employer] Vaccine Injury Compensation Program (VICP) is a  federal program that was created to compensate people who may have been injured by certain vaccines. Claims regarding alleged injury or death due to vaccination have a time limit for filing, which may be as  short as two years. Visit the VICP website at SpiritualWord.at or call 931-622-2373 to learn about the program and about filing a claim. 7. How can I learn more? Ask your health care provider. Call your local or state health department. Visit the website of the Food and Drug Administration (FDA) for vaccine package inserts and additional information at FinderList.no. Contact the Centers for Disease Control and Prevention (CDC): Call 737-738-9397 (1-800-CDC-INFO) or Visit CDC's website at PicCapture.uy. Source: CDC Vaccine Information Statement HPV Vaccine (11/24/2019) This same material is available at FootballExhibition.com.br for no charge. This information is not intended to replace advice given to you by your health care provider. Make sure you discuss any questions you have with your health care provider. Document Revised: 07/22/2022 Document Reviewed: 04/27/2022 Elsevier Patient Education  2024 [employer].

## 2024-04-19 NOTE — Progress Notes (Deleted)
 "  Referring Provider:  ***  HPI:  Alicia Perez is a 43 y.o.  G1P1001  who presents today for evaluation and management of abnormal cervical cytology.    Prior pap smears:  12/23/2023: NILM HPV + (negative 16/18/45) 11/19/2021: NILM HPV + (undiff) 10/23/2011: ASCUS HPV + 2008 - abnormal pap with colpo in [place] , unsure of result   Prior cervical / vaginal findings: Colposcopy at [facility] 2014 and in [place]  2008 03/16/23: Colpo [facility]: 6:00 CIN1 2014:  Colpo [facility]: CIN 2-3 on bx (12&3), no ECC performed 2008: unsure of results, no records available   Prior cervical treatment(s): LEEP 03/29/2023: CIN2, ECC benign 08/10/2012:  CIN1 with endocervical involvement @ 12:00 and 3:00 (biopsy site reaction).  CIN 1 with endocervical gland involvement (A1, A3) on ECC.   Symptoms/History:  -Abnormal vaginal discharge: brown discharge -Postmenopausal: no -Intermenstrual bleeding: none -Postcoital bleeding: none  -Bleeding problems (non-gyn): none -Contraception: Depo -Number of current sexual partners: 0 -Number of partners in lifetime: 6 -History of a high risk partner: none -History of STDs: trich -Smoking: none -Gardasil Vaccine: no      ROS:  {Ros - complete:30496}  OB History  Gravida Para Term Preterm AB Living  1 1 1   1   SAB IAB Ectopic Multiple Live Births      1    # Outcome Date GA Lbr Len/2nd Weight Sex Type Anes PTL Lv  1 Term     F Vag-Spont   LIV    Past Medical History:  Diagnosis Date   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     ALLERGIES:  Codeine and Penicillins  She has a current medication list which includes the following prescription(s): aspirin  ec, atorvastatin , vitamin d-1000 max st, gabapentin, hydrochlorothiazide, medroxyprogesterone , metoprolol succinate, misoprostol , omeprazole, orlistat, and zolpidem.  Physical Exam: -Vitals:  There were no vitals taken for this visit.  PROCEDURE: Colposcopy performed with 4% acetic acid and Lugol's after informed consent obtained. Urine pregnancy test negative***.  Physical Exam                            -Aceto-white Lesions Location(s): See above              -Biopsy performed at *** o'clock               -ECC indicated and performed: {yes no:314532}     -Biopsy sites made hemostatic with pressure and Monsel's solution   -Satisfactory colposcopy: {yes no:314532}    -Evidence of Invasive cervical CA :  NO  ASSESSMENT:  Alicia Perez is a 43 y.o. G1P1001 with LSIL***ASCUS and HPV-HR positive*** 16/18/45 POS***NEG on recent pap (DATE), here for colposcopy today, performed as above without complications.  -ECC and *** cervical bx sent to pathology -Aftercare instructions for home reviewed, si/sx of when to call/return discussed. -Gardasil counseling done, #1 given today *** pt to consider.  -Discussed  possible outcomes based on pathology; will call with results   Estil Mangle, DO [facility] [place] "

## 2024-04-26 ENCOUNTER — Encounter: Admitting: Obstetrics

## 2024-04-26 DIAGNOSIS — Z01812 Encounter for preprocedural laboratory examination: Secondary | ICD-10-CM

## 2024-04-26 DIAGNOSIS — R87810 Cervical high risk human papillomavirus (HPV) DNA test positive: Secondary | ICD-10-CM

## 2024-06-05 ENCOUNTER — Encounter: Admitting: Obstetrics & Gynecology

## 2024-08-15 ENCOUNTER — Ambulatory Visit
